# Patient Record
Sex: Female | Born: 1965 | Race: White | Hispanic: No | Marital: Single | Smoking: Never smoker
Health system: Southern US, Community
[De-identification: ages and names within clinical notes are randomized; demographics above are authoritative.]

## PROBLEM LIST (undated history)

## (undated) DIAGNOSIS — D509 Iron deficiency anemia, unspecified: Secondary | ICD-10-CM

## (undated) DIAGNOSIS — F410 Panic disorder [episodic paroxysmal anxiety] without agoraphobia: Secondary | ICD-10-CM

## (undated) DIAGNOSIS — J189 Pneumonia, unspecified organism: Secondary | ICD-10-CM

## (undated) DIAGNOSIS — F419 Anxiety disorder, unspecified: Secondary | ICD-10-CM

## (undated) DIAGNOSIS — D126 Benign neoplasm of colon, unspecified: Secondary | ICD-10-CM

## (undated) DIAGNOSIS — R112 Nausea with vomiting, unspecified: Secondary | ICD-10-CM

## (undated) DIAGNOSIS — Z9889 Other specified postprocedural states: Secondary | ICD-10-CM

## (undated) DIAGNOSIS — K589 Irritable bowel syndrome without diarrhea: Secondary | ICD-10-CM

## (undated) HISTORY — PX: ABLATION: SHX5711

## (undated) HISTORY — PX: COLONOSCOPY: SHX174

## (undated) HISTORY — DX: Benign neoplasm of colon, unspecified: D12.6

## (undated) HISTORY — DX: Iron deficiency anemia, unspecified: D50.9

## (undated) HISTORY — PX: CHOLECYSTECTOMY: SHX55

---

## 1999-09-07 ENCOUNTER — Emergency Department (HOSPITAL_COMMUNITY): Admission: EM | Admit: 1999-09-07 | Discharge: 1999-09-08 | Payer: Self-pay | Admitting: Emergency Medicine

## 1999-10-28 ENCOUNTER — Other Ambulatory Visit: Admission: RE | Admit: 1999-10-28 | Discharge: 1999-10-28 | Payer: Self-pay | Admitting: Gynecology

## 2000-06-17 ENCOUNTER — Other Ambulatory Visit: Admission: RE | Admit: 2000-06-17 | Discharge: 2000-06-17 | Payer: Self-pay | Admitting: Gynecology

## 2001-06-30 ENCOUNTER — Other Ambulatory Visit: Admission: RE | Admit: 2001-06-30 | Discharge: 2001-06-30 | Payer: Self-pay | Admitting: Gynecology

## 2002-02-12 ENCOUNTER — Emergency Department (HOSPITAL_COMMUNITY): Admission: EM | Admit: 2002-02-12 | Discharge: 2002-02-12 | Payer: Self-pay | Admitting: *Deleted

## 2002-10-05 ENCOUNTER — Emergency Department (HOSPITAL_COMMUNITY): Admission: EM | Admit: 2002-10-05 | Discharge: 2002-10-06 | Payer: Self-pay | Admitting: Emergency Medicine

## 2004-07-28 ENCOUNTER — Inpatient Hospital Stay (HOSPITAL_COMMUNITY): Admission: AD | Admit: 2004-07-28 | Discharge: 2004-07-28 | Payer: Self-pay | Admitting: Obstetrics and Gynecology

## 2004-08-06 ENCOUNTER — Other Ambulatory Visit: Admission: RE | Admit: 2004-08-06 | Discharge: 2004-08-06 | Payer: Self-pay | Admitting: Obstetrics and Gynecology

## 2005-02-16 ENCOUNTER — Inpatient Hospital Stay (HOSPITAL_COMMUNITY): Admission: AD | Admit: 2005-02-16 | Discharge: 2005-02-16 | Payer: Self-pay | Admitting: Obstetrics and Gynecology

## 2005-02-23 ENCOUNTER — Inpatient Hospital Stay (HOSPITAL_COMMUNITY): Admission: AD | Admit: 2005-02-23 | Discharge: 2005-02-23 | Payer: Self-pay | Admitting: Obstetrics and Gynecology

## 2005-02-28 ENCOUNTER — Inpatient Hospital Stay (HOSPITAL_COMMUNITY): Admission: AD | Admit: 2005-02-28 | Discharge: 2005-02-28 | Payer: Self-pay | Admitting: Obstetrics and Gynecology

## 2005-03-06 ENCOUNTER — Inpatient Hospital Stay (HOSPITAL_COMMUNITY): Admission: AD | Admit: 2005-03-06 | Discharge: 2005-03-06 | Payer: Self-pay | Admitting: Obstetrics and Gynecology

## 2005-03-08 ENCOUNTER — Inpatient Hospital Stay (HOSPITAL_COMMUNITY): Admission: AD | Admit: 2005-03-08 | Discharge: 2005-03-08 | Payer: Self-pay | Admitting: Obstetrics and Gynecology

## 2005-03-10 ENCOUNTER — Inpatient Hospital Stay (HOSPITAL_COMMUNITY): Admission: AD | Admit: 2005-03-10 | Discharge: 2005-03-12 | Payer: Self-pay | Admitting: Obstetrics and Gynecology

## 2005-04-21 ENCOUNTER — Other Ambulatory Visit: Admission: RE | Admit: 2005-04-21 | Discharge: 2005-04-21 | Payer: Self-pay | Admitting: Obstetrics and Gynecology

## 2006-09-11 ENCOUNTER — Ambulatory Visit: Payer: Self-pay | Admitting: Internal Medicine

## 2006-10-13 ENCOUNTER — Ambulatory Visit: Payer: Self-pay | Admitting: Internal Medicine

## 2006-10-13 LAB — CONVERTED CEMR LAB
ALT: 15 units/L (ref 0–40)
AST: 16 units/L (ref 0–37)
Albumin: 3.9 g/dL (ref 3.5–5.2)
Alkaline Phosphatase: 41 units/L (ref 39–117)
BUN: 11 mg/dL (ref 6–23)
Basophils Absolute: 0 10*3/uL (ref 0.0–0.1)
Basophils Relative: 0.6 % (ref 0.0–1.0)
CO2: 29 meq/L (ref 19–32)
Calcium: 9.1 mg/dL (ref 8.4–10.5)
Chloride: 103 meq/L (ref 96–112)
Chol/HDL Ratio, serum: 4.4
Cholesterol: 167 mg/dL (ref 0–200)
Creatinine, Ser: 0.8 mg/dL (ref 0.4–1.2)
Eosinophil percent: 1.5 % (ref 0.0–5.0)
GFR calc non Af Amer: 86 mL/min
Glomerular Filtration Rate, Af Am: 104 mL/min/{1.73_m2}
Glucose, Bld: 72 mg/dL (ref 70–99)
HCT: 36.6 % (ref 36.0–46.0)
HDL: 38.1 mg/dL — ABNORMAL LOW (ref >39.0)
Hemoglobin: 12.5 g/dL (ref 12.0–15.0)
LDL Cholesterol: 105 mg/dL — ABNORMAL HIGH (ref 0–99)
Lymphocytes Relative: 33.8 % (ref 12.0–46.0)
MCHC: 34.1 g/dL (ref 30.0–36.0)
MCV: 82.9 fL (ref 78.0–100.0)
Monocytes Absolute: 0.5 10*3/uL (ref 0.2–0.7)
Monocytes Relative: 7.6 % (ref 3.0–11.0)
Neutro Abs: 3.6 10*3/uL (ref 1.4–7.7)
Neutrophils Relative %: 56.5 % (ref 43.0–77.0)
Platelets: 230 10*3/uL (ref 150–400)
Potassium: 3.5 meq/L (ref 3.5–5.1)
RBC: 4.41 M/uL (ref 3.87–5.11)
RDW: 12.7 % (ref 11.5–14.6)
Sodium: 140 meq/L (ref 135–145)
TSH: 1.66 microintl units/mL (ref 0.35–5.50)
Total Bilirubin: 0.6 mg/dL (ref 0.3–1.2)
Total Protein: 7.4 g/dL (ref 6.0–8.3)
Triglyceride fasting, serum: 120 mg/dL (ref 0–149)
VLDL: 24 mg/dL (ref 0–40)
WBC: 6.4 10*3/uL (ref 4.5–10.5)

## 2006-10-19 ENCOUNTER — Ambulatory Visit: Payer: Self-pay | Admitting: Internal Medicine

## 2006-10-19 DIAGNOSIS — G43909 Migraine, unspecified, not intractable, without status migrainosus: Secondary | ICD-10-CM

## 2006-12-01 ENCOUNTER — Emergency Department (HOSPITAL_COMMUNITY): Admission: EM | Admit: 2006-12-01 | Discharge: 2006-12-01 | Payer: Self-pay | Admitting: Emergency Medicine

## 2006-12-28 ENCOUNTER — Ambulatory Visit (HOSPITAL_COMMUNITY): Admission: RE | Admit: 2006-12-28 | Discharge: 2006-12-29 | Payer: Self-pay | Admitting: General Surgery

## 2006-12-28 ENCOUNTER — Encounter (INDEPENDENT_AMBULATORY_CARE_PROVIDER_SITE_OTHER): Payer: Self-pay | Admitting: *Deleted

## 2007-02-24 ENCOUNTER — Ambulatory Visit: Payer: Self-pay | Admitting: Internal Medicine

## 2007-06-25 ENCOUNTER — Emergency Department (HOSPITAL_COMMUNITY): Admission: EM | Admit: 2007-06-25 | Discharge: 2007-06-25 | Payer: Self-pay | Admitting: Emergency Medicine

## 2007-07-08 ENCOUNTER — Other Ambulatory Visit: Admission: RE | Admit: 2007-07-08 | Discharge: 2007-07-08 | Payer: Self-pay | Admitting: Gynecology

## 2007-09-06 ENCOUNTER — Encounter: Admission: RE | Admit: 2007-09-06 | Discharge: 2007-09-06 | Payer: Self-pay | Admitting: Internal Medicine

## 2007-09-07 ENCOUNTER — Ambulatory Visit: Payer: Self-pay | Admitting: Internal Medicine

## 2007-09-07 DIAGNOSIS — J309 Allergic rhinitis, unspecified: Secondary | ICD-10-CM

## 2007-09-07 DIAGNOSIS — F410 Panic disorder [episodic paroxysmal anxiety] without agoraphobia: Secondary | ICD-10-CM

## 2007-11-16 ENCOUNTER — Ambulatory Visit: Payer: Self-pay | Admitting: Internal Medicine

## 2007-11-16 DIAGNOSIS — F329 Major depressive disorder, single episode, unspecified: Secondary | ICD-10-CM

## 2008-01-10 ENCOUNTER — Telehealth: Payer: Self-pay | Admitting: Internal Medicine

## 2008-03-20 ENCOUNTER — Telehealth: Payer: Self-pay | Admitting: Internal Medicine

## 2008-08-23 ENCOUNTER — Telehealth: Payer: Self-pay | Admitting: Internal Medicine

## 2008-10-26 ENCOUNTER — Ambulatory Visit: Payer: Self-pay | Admitting: Internal Medicine

## 2008-12-14 ENCOUNTER — Ambulatory Visit: Payer: Self-pay | Admitting: Internal Medicine

## 2008-12-14 LAB — CONVERTED CEMR LAB
ALT: 14 units/L (ref 0–35)
AST: 17 units/L (ref 0–37)
Albumin: 3.7 g/dL (ref 3.5–5.2)
Basophils Absolute: 0 10*3/uL (ref 0.0–0.1)
Bilirubin Urine: NEGATIVE
Bilirubin, Direct: 0.1 mg/dL (ref 0.0–0.3)
Blood in Urine, dipstick: NEGATIVE
Calcium: 8.8 mg/dL (ref 8.4–10.5)
Chloride: 105 meq/L (ref 96–112)
Cholesterol: 174 mg/dL (ref 0–200)
Creatinine, Ser: 0.7 mg/dL (ref 0.4–1.2)
Eosinophils Absolute: 0.1 10*3/uL (ref 0.0–0.7)
GFR calc Af Amer: 119 mL/min
Glucose, Bld: 82 mg/dL (ref 70–99)
HDL: 44.6 mg/dL (ref >39.0)
Iron: 42 ug/dL (ref 42–145)
Ketones, urine, test strip: NEGATIVE
Lymphocytes Relative: 31.4 % (ref 12.0–46.0)
MCHC: 33.5 g/dL (ref 30.0–36.0)
MCV: 77.2 fL — ABNORMAL LOW (ref 78.0–100.0)
Potassium: 4 meq/L (ref 3.5–5.1)
RBC: 4.25 M/uL (ref 3.87–5.11)
RDW: 15.5 % — ABNORMAL HIGH (ref 11.5–14.6)
Saturation Ratios: 10.1 % — ABNORMAL LOW (ref 20.0–50.0)
Total Protein: 7.2 g/dL (ref 6.0–8.3)
Transferrin: 296.7 mg/dL (ref >=212.0)
VLDL: 22 mg/dL (ref 0–40)
WBC Urine, dipstick: NEGATIVE
pH: 6

## 2008-12-21 ENCOUNTER — Ambulatory Visit: Payer: Self-pay | Admitting: Internal Medicine

## 2008-12-21 DIAGNOSIS — D509 Iron deficiency anemia, unspecified: Secondary | ICD-10-CM | POA: Insufficient documentation

## 2008-12-21 HISTORY — DX: Iron deficiency anemia, unspecified: D50.9

## 2009-01-05 ENCOUNTER — Telehealth: Payer: Self-pay | Admitting: Internal Medicine

## 2009-01-07 ENCOUNTER — Ambulatory Visit: Payer: Self-pay | Admitting: Family Medicine

## 2009-02-09 ENCOUNTER — Ambulatory Visit: Payer: Self-pay | Admitting: Family Medicine

## 2009-03-09 ENCOUNTER — Ambulatory Visit: Payer: Self-pay | Admitting: Internal Medicine

## 2009-03-09 LAB — CONVERTED CEMR LAB
Ferritin: 15.5 ng/mL (ref 10.0–291.0)
Lymphocytes Relative: 28.9 % (ref 12.0–46.0)
MCHC: 34.3 g/dL (ref 30.0–36.0)
Neutrophils Relative %: 60.8 % (ref 43.0–77.0)
Platelets: 283 10*3/uL (ref 150–400)
RDW: 15.3 % — ABNORMAL HIGH (ref 11.5–14.6)

## 2009-07-24 ENCOUNTER — Telehealth (INDEPENDENT_AMBULATORY_CARE_PROVIDER_SITE_OTHER): Payer: Self-pay | Admitting: *Deleted

## 2009-09-27 ENCOUNTER — Telehealth: Payer: Self-pay | Admitting: Internal Medicine

## 2009-11-21 ENCOUNTER — Ambulatory Visit (HOSPITAL_COMMUNITY): Admission: RE | Admit: 2009-11-21 | Discharge: 2009-11-21 | Payer: Self-pay | Admitting: Internal Medicine

## 2010-01-24 ENCOUNTER — Telehealth: Payer: Self-pay | Admitting: Internal Medicine

## 2010-02-09 ENCOUNTER — Emergency Department (HOSPITAL_BASED_OUTPATIENT_CLINIC_OR_DEPARTMENT_OTHER): Admission: EM | Admit: 2010-02-09 | Discharge: 2010-02-09 | Payer: Self-pay | Admitting: Emergency Medicine

## 2010-02-09 ENCOUNTER — Encounter (INDEPENDENT_AMBULATORY_CARE_PROVIDER_SITE_OTHER): Payer: Self-pay | Admitting: *Deleted

## 2010-02-09 ENCOUNTER — Ambulatory Visit: Payer: Self-pay | Admitting: Diagnostic Radiology

## 2010-02-12 ENCOUNTER — Ambulatory Visit: Payer: Self-pay | Admitting: Internal Medicine

## 2010-03-18 ENCOUNTER — Ambulatory Visit: Payer: Self-pay | Admitting: Internal Medicine

## 2010-03-19 ENCOUNTER — Telehealth: Payer: Self-pay | Admitting: Internal Medicine

## 2010-03-20 ENCOUNTER — Encounter: Payer: Self-pay | Admitting: Family Medicine

## 2010-04-22 ENCOUNTER — Emergency Department (HOSPITAL_BASED_OUTPATIENT_CLINIC_OR_DEPARTMENT_OTHER): Admission: EM | Admit: 2010-04-22 | Discharge: 2010-04-22 | Payer: Self-pay | Admitting: Internal Medicine

## 2010-04-22 ENCOUNTER — Ambulatory Visit: Payer: Self-pay | Admitting: Diagnostic Radiology

## 2010-04-22 ENCOUNTER — Telehealth: Payer: Self-pay | Admitting: Internal Medicine

## 2010-04-25 ENCOUNTER — Ambulatory Visit: Payer: Self-pay | Admitting: Internal Medicine

## 2010-04-26 LAB — CONVERTED CEMR LAB
ALT: 17 units/L (ref 0–35)
AST: 17 units/L (ref 0–37)
Alkaline Phosphatase: 32 units/L — ABNORMAL LOW (ref 39–117)
BUN: 12 mg/dL (ref 6–23)
Basophils Absolute: 0 10*3/uL (ref 0.0–0.1)
CO2: 30 meq/L (ref 19–32)
Chloride: 108 meq/L (ref 96–112)
Eosinophils Absolute: 0.1 10*3/uL (ref 0.0–0.7)
GFR calc non Af Amer: 97.81 mL/min (ref >60)
Hemoglobin: 10.7 g/dL — ABNORMAL LOW (ref 12.0–15.0)
MCHC: 33.5 g/dL (ref 30.0–36.0)
Monocytes Absolute: 0.3 10*3/uL (ref 0.1–1.0)
Monocytes Relative: 6.7 % (ref 3.0–12.0)
Neutro Abs: 2.9 10*3/uL (ref 1.4–7.7)
Potassium: 3.8 meq/L (ref 3.5–5.1)
Total Bilirubin: 0.3 mg/dL (ref 0.3–1.2)
WBC: 4.7 10*3/uL (ref 4.5–10.5)

## 2010-04-29 LAB — CONVERTED CEMR LAB
Ferritin: 7 ng/mL — ABNORMAL LOW (ref 10.0–291.0)
Iron: 28 ug/dL — ABNORMAL LOW (ref 42–145)
Transferrin: 264 mg/dL (ref 212.0–360.0)

## 2010-07-16 ENCOUNTER — Telehealth: Payer: Self-pay | Admitting: Internal Medicine

## 2010-08-02 ENCOUNTER — Ambulatory Visit: Payer: Self-pay | Admitting: Internal Medicine

## 2010-08-05 LAB — CONVERTED CEMR LAB
Basophils Absolute: 0 10*3/uL (ref 0.0–0.1)
Basophils Relative: 0.6 % (ref 0.0–3.0)
Eosinophils Absolute: 0.1 10*3/uL (ref 0.0–0.7)
MCV: 84.8 fL (ref 78.0–100.0)
Monocytes Relative: 7.4 % (ref 3.0–12.0)
Neutrophils Relative %: 62 % (ref 43.0–77.0)
Platelets: 247 10*3/uL (ref 150.0–400.0)
RDW: 14.8 % — ABNORMAL HIGH (ref 11.5–14.6)

## 2010-08-27 ENCOUNTER — Telehealth: Payer: Self-pay | Admitting: Internal Medicine

## 2010-09-11 ENCOUNTER — Ambulatory Visit: Payer: Self-pay | Admitting: Internal Medicine

## 2010-10-21 ENCOUNTER — Ambulatory Visit: Payer: Self-pay | Admitting: Internal Medicine

## 2010-10-21 DIAGNOSIS — J069 Acute upper respiratory infection, unspecified: Secondary | ICD-10-CM | POA: Insufficient documentation

## 2010-10-22 ENCOUNTER — Encounter: Payer: Self-pay | Admitting: Internal Medicine

## 2010-12-12 ENCOUNTER — Ambulatory Visit (HOSPITAL_COMMUNITY)
Admission: RE | Admit: 2010-12-12 | Discharge: 2010-12-12 | Payer: Self-pay | Source: Home / Self Care | Attending: Internal Medicine | Admitting: Internal Medicine

## 2010-12-12 ENCOUNTER — Encounter: Payer: Self-pay | Admitting: Internal Medicine

## 2011-01-19 ENCOUNTER — Encounter: Payer: Self-pay | Admitting: Internal Medicine

## 2011-01-30 NOTE — Assessment & Plan Note (Signed)
Summary: swollen areas around both clavicles/soreness/cjr   Vital Signs:  Patient profile:   45 year old female Height:      67.25 inches Weight:      176 pounds BMI:     27.46 Temp:     98.3 degrees F BP sitting:   120 / 80  (left arm) Cuff size:   regular  Vitals Entered By: Kern Reap CMA Duncan Dull) (March 18, 2010 12:32 PM) CC: swelling with neck Is Patient Diabetic? No Pain Assessment Patient in pain? no        CC:  swelling with neck.  History of Present Illness: 3 days ago she saw neck in mirror and thought it was swollen.  patient comes in with her friend.  Patient describes recently noticing that supraclavicular areas bilaterally and appear more full in usual.  She is concerned that this is a mass.  There is no tenderness.  She denies any trouble swallowing.  No unusual symptoms.  No unusual exposures.  No difficulty breathing.  No other associated symptoms.  No other complaints in a complete review of systems.  Current Problems (verified): 1)  Preventive Health Care  (ICD-V70.0) 2)  Anemia-iron Deficiency  (ICD-280.9) 3)  Depression  (ICD-311) 4)  Anxiety  (ICD-300.00) 5)  Allergic Rhinitis  (ICD-477.9) 6)  Migraine  (ICD-346.90)  Current Medications (verified): 1)  Ferrous Fumarate 325 Mg Tabs (Ferrous Fumarate) .... Take 1 Tablet By Mouth Two Times A Day 2)  Imitrex 100 Mg Tabs (Sumatriptan Succinate) .... Take 1 Tablet By Mouth Once A Day As Needed 3)  Ibuprofen 200 Mg Caps (Ibuprofen) .... As Needed 4)  Omeprazole 20 Mg Cpdr (Omeprazole) .... One By Mouth Daily  Allergies (verified): 1)  ! Citalopram Hydrobromide (Citalopram Hydrobromide)  Past History:  Past Medical History: Last updated: 12/21/2008 Allergic rhinitis Anxiety Depression Anemia-iron deficiency  2009 likely menorrhagia  Past Surgical History: Last updated: 09/07/2007 Panendoscopy  Family History: Last updated: 11-02-06 Father-deceased 6 yo pneurmonia Mother- deceased Brain  tumor  Social History: Last updated: 12/21/2008 Occupation: "[employer]" Married Never Smoked Alcohol use-no  Risk Factors: Smoking Status: never (02/12/2010)  Physical Exam  General:  alert and well-developed.   Head:  normocephalic and atraumatic.   Eyes:  pupils equal and pupils round.   Neck:  neck appears normal.  She has full range of motion.  The neck is thick with adipose tissue subcutaneously.  No supraclavicular masses.  No lymphadenopathy of the anterior cervical chain, supraclavicular areas or posterior cervical chain. Heart:  normal rate and regular rhythm.     Impression & Recommendations:  Problem # 1:  NECK PAIN (ICD-723.1)  subjective abnormality---neck  no objective findings reassured she and frien voice understnading  Her updated medication list for this problem includes:    Ibuprofen 200 Mg Caps (Ibuprofen) .Marland Kitchen... As needed  Complete Medication List: 1)  Ferrous Fumarate 325 Mg Tabs (Ferrous fumarate) .... Take 1 tablet by mouth two times a day 2)  Imitrex 100 Mg Tabs (Sumatriptan succinate) .... Take 1 tablet by mouth once a day as needed 3)  Ibuprofen 200 Mg Caps (Ibuprofen) .... As needed 4)  Omeprazole 20 Mg Cpdr (Omeprazole) .... One by mouth daily

## 2011-01-30 NOTE — Letter (Signed)
Summary: Out of School   at [employer]  [address] [place], [place] [address]   Phone: [phone]  Fax: [phone]    March 20, 2010   Student:  Donna Hancock    To Whom It May Concern:   For Medical reasons, please excuse the above named student from school for the following dates:  Start:   March 18, 2010  End:    March 19, 2010  If you need additional information, please feel free to contact our office.   Sincerely,    Birdie Sons, MD    ****This is a legal document and cannot be tampered with.  Schools are authorized to verify all information and to do so accordingly.

## 2011-01-30 NOTE — Progress Notes (Signed)
Summary: Pt would like to pick up copy of immunization record on 08/02/10  Phone Note Call from Patient Call back at Home Phone [phone]   Caller: Patient Reason for Call: Acute Illness Summary of Call: Pt needs to know if Dr. Cato Mulligan has a copy of her immunization records. Pt is needing to get a copy for college. Pt is coming in for cpx on 08/02/10 and pt would like to pick this up then.     Initial call taken by: Lucy Antigua,  July 16, 2010 11:37 AM  Follow-up for Phone Call        pt informed only immunization we have is td we gave- suggested calling school or pediatricain office Follow-up by: Willy Eddy, LPN,  July 16, 2010 11:48 AM

## 2011-01-30 NOTE — Assessment & Plan Note (Signed)
Summary: college cpx/pt will bring form/njr   Vital Signs:  Patient profile:   45 year old female Height:      67.25 inches Weight:      173 pounds BMI:     26.99 Temp:     98.1 degrees F oral Pulse rate:   72 / minute Resp:     14 per minute BP sitting:   120 / 70  (left arm)  Vitals Entered By: Willy Eddy, LPN (August 02, 2010 10:04 AM) CC: college cpx Is Patient Diabetic? No   CC:  college cpx.  History of Present Illness: CPX  Preventive Screening-Counseling & Management  Alcohol-Tobacco     Smoking Status: never  Current Problems (verified): 1)  Preventive Health Care  (ICD-V70.0) 2)  Anemia-iron Deficiency  (ICD-280.9) 3)  Depression  (ICD-311) 4)  Anxiety  (ICD-300.00) 5)  Allergic Rhinitis  (ICD-477.9) 6)  Migraine  (ICD-346.90)  Current Medications (verified): 1)  Hemocyte 324 Mg Tabs (Ferrous Fumarate) .... Take 1 Tablet By Mouth Two Times A Day 2)  Imitrex 100 Mg Tabs (Sumatriptan Succinate) .... Take 1 Tablet By Mouth Once A Day As Needed 3)  Ibuprofen 200 Mg Caps (Ibuprofen) .... As Needed 4)  Omeprazole 20 Mg Cpdr (Omeprazole) .... One By Mouth Daily  Allergies (verified): 1)  ! Citalopram Hydrobromide (Citalopram Hydrobromide)  Past History:  Past Medical History: Last updated: 12/21/2008 Allergic rhinitis Anxiety Depression Anemia-iron deficiency  2009 likely menorrhagia  Family History: Last updated: 11/16/06 Father-deceased 43 yo pneurmonia Mother- deceased Brain tumor  Social History: Last updated: 12/21/2008 Occupation: "[employer]" Married Never Smoked Alcohol use-no  Risk Factors: Smoking Status: never (08/02/2010)  Past Surgical History: Panendoscopy Cholecystectomy  Physical Exam  General:  alert and well-developed.   Head:  normocephalic and atraumatic.   Eyes:  pupils equal and pupils round.   Ears:  R ear normal and L ear normal.   Neck:  No deformities, masses, or tenderness noted. Chest Wall:   No deformities, masses, or tenderness noted. Lungs:  normal respiratory effort and no intercostal retractions.   Heart:  normal rate and regular rhythm.   Abdomen:  soft and non-tender.   Msk:  No deformity or scoliosis noted of thoracic or lumbar spine.   Pulses:  R and L carotid,radial,femoral,dorsalis pedis and posterior tibial pulses are full and equal bilaterally Neurologic:  cranial nerves II-XII intact and gait normal.   Skin:  turgor normal and color normal.   Cervical Nodes:  no anterior cervical adenopathy and no posterior cervical adenopathy.   Psych:  normally interactive and good eye contact.     Impression & Recommendations:  Problem # 1:  PREVENTIVE HEALTH CARE (ICD-V70.0) advised need for daily exercise, low fat diet and weight loss Orders: UA Dipstick w/o Micro (manual) ([address]109)  see immunization TB skin test  Complete Medication List: 1)  Hemocyte 324 Mg Tabs (Ferrous fumarate) .... Take 1 tablet by mouth two times a day 2)  Imitrex 100 Mg Tabs (Sumatriptan succinate) .... Take 1 tablet by mouth once a day as needed  Patient Instructions: 1)  schedule f/u immunizations in one month and 6 months 2)  get your skin TB checked on Monday Prescriptions: IMITREX 100 MG TABS (SUMATRIPTAN SUCCINATE) Take 1 tablet by mouth once a day as needed  #6 Each x 6   Entered and Authorized by:   Birdie Sons MD   Signed by:   Birdie Sons MD on 08/02/2010  Method used:   Electronically to        [facility] [phone]* (retail)       [address] [place], [place]  [address]       Ph: [phone]       Fax: [phone]   RxID:   [id number]     Appended Document: college cpx/pt will bring form/njr   Appended Document: Orders Update    Clinical Lists Changes  Orders: Added new Service order of State-Hepatitis B Vaccine Ped/Adol 3 dose IM  (27253G) - Signed Added new Service order of Admin 1st Vaccine (64403) - Signed Added new Service order of TB Skin  Test [id number]) - Signed Added new Service order of Admin of Any Addtl Vaccine (95638) - Signed Observations: Added new observation of TB-PPD LOT#: [id number] (08/02/2010 11:06) Added new observation of TB-PPD EXP: 10/27/2011 (08/02/2010 11:06) Added new observation of TB-PPD BY: Willy Eddy, LPN (95/18/8416 11:06) Added new observation of TB-PPD RTE: ID (08/02/2010 11:06) Added new observation of TB-PPD DSE: 0.1 ml (08/02/2010 11:06) Added new observation of TB-PPD MFR: Sanofi Pasteur (08/02/2010 11:06) Added new observation of TB-PPD SITE: left forearm (08/02/2010 11:06) Added new observation of TB-PPD: PPD (08/02/2010 11:06) Added new observation of HEPBVAX#1LOT: 1315z (08/02/2010 11:06) Added new observation of HEPBVAX#1EXP: 10/29/2011 (08/02/2010 11:06) Added new observation of HEPBVAX#1BY: Willy Eddy, LPN (60/63/0160 11:06) Added new observation of HEPBVAX#1RTE: IM (08/02/2010 11:06) Added new observation of HEPBVAX#1DSE: 0.1 ml (08/02/2010 11:06) Added new observation of HEPBVAX#1MFR: Merck (08/02/2010 11:06) Added new observation of HEPBVAX#1SIT: left deltoid (08/02/2010 11:06) Added new observation of HEPBVAX#1: State HepB Ped/Adol (08/02/2010 11:06)       Immunizations Administered:  Hepatitis B Vaccine # 1:    Vaccine Type: State HepB Ped/Adol    Site: left deltoid    Mfr: Merck    Dose: 0.1 ml    Route: IM    Given by: Willy Eddy, LPN    Exp. Date: 10/29/2011    Lot #: [id number]  PPD Skin Test:    Vaccine Type: PPD    Site: left forearm    Mfr: Sanofi Pasteur    Dose: 0.1 ml    Route: ID    Given by: Willy Eddy, LPN    Exp. Date: 10/27/2011    Lot #: [id number]  Appended Document: college cpx/pt will bring form/njr  Laboratory Results   Urine Tests    Routine Urinalysis   Color: yellow Appearance: Clear Glucose: negative   (Normal Range: Negative) Bilirubin: negative   (Normal Range: Negative) Ketone: negative   (Normal Range:  Negative) Spec. Gravity: 1.025   (Normal Range: 1.003-1.035) Blood: negative   (Normal Range: Negative) pH: 6.5   (Normal Range: 5.0-8.0) Protein: negative   (Normal Range: Negative) Urobilinogen: 0.2   (Normal Range: 0-1) Nitrite: negative   (Normal Range: Negative) Leukocyte Esterace: negative   (Normal Range: Negative)    Comments: Rita Ohara  August 02, 2010 11:47 AM      Appended Document: Orders Update    Clinical Lists Changes        PPD Results    Date of reading: 08/05/2010    Results: < 5mm    Interpretation: negative

## 2011-01-30 NOTE — Assessment & Plan Note (Signed)
Summary: sore throat/cough/congestion/cjr   Vital Signs:  Patient profile:   45 year old female Weight:      172 pounds Temp:     99.0 degrees F oral BP sitting:   110 / 72  (left arm) Cuff size:   regular  Vitals Entered By: Alfred Levins, CMA (October 21, 2010 10:16 AM) CC: st off and on x2 wks, cough, no fever   CC:  st off and on x2 wks, cough, and no fever.  History of Present Illness: st on and off of r 2 weeks no fever or chills no sweats some pain with swallowing  All other systems reviewed and were negative   Current Medications (verified): 1)  Hemocyte 324 Mg Tabs (Ferrous Fumarate) .... Take 1 Tablet By Mouth Two Times A Day 2)  Imitrex 100 Mg Tabs (Sumatriptan Succinate) .... Take 1 Tablet By Mouth Once A Day As Needed  Allergies (verified): 1)  ! Citalopram Hydrobromide (Citalopram Hydrobromide)  Past History:  Past Medical History: Last updated: 12/21/2008 Allergic rhinitis Anxiety Depression Anemia-iron deficiency  2009 likely menorrhagia  Past Surgical History: Last updated: 08/02/2010 Panendoscopy Cholecystectomy  Family History: Last updated: 2006/11/09 Father-deceased 38 yo pneurmonia Mother- deceased Brain tumor  Social History: Last updated: 12/21/2008 Occupation: "[employer]" Married Never Smoked Alcohol use-no  Risk Factors: Smoking Status: never (08/02/2010)  Physical Exam  General:  Well-developed,well-nourished,in no acute distress; alert,appropriate and cooperative throughout examination Mouth:  aphthous ulcer soft palate on left.  Neck:  No deformities, masses, or tenderness noted. Lungs:  Normal respiratory effort, chest expands symmetrically. Lungs are clear to auscultation, no crackles or wheezes.   Impression & Recommendations:  Problem # 1:  URI (ICD-465.9)  ulcer--soft palate ? herpetic--- send for culture.  call if sxs persist.   Orders: T-Herpes Culture (53664)  Complete Medication List: 1)   Hemocyte 324 Mg Tabs (Ferrous fumarate) .... Take 1 tablet by mouth two times a day 2)  Imitrex 100 Mg Tabs (Sumatriptan succinate) .... Take 1 tablet by mouth once a day as needed   Orders Added: 1)  T-Herpes Culture [40347] 2)  Est. Patient Level III [42595]

## 2011-01-30 NOTE — Progress Notes (Signed)
Summary: Pt req doctors note excusing her from class on 03/18/10  Phone Note Call from Patient Call back at Home Phone [phone]   Caller: Patient Summary of Call: Pt called and is needing to get a doctors note, excuses from class on 03/18/10. Please send to [facility]. Pt will call back to give fax #.    Initial call taken by: Lucy Antigua,  March 19, 2010 3:50 PM  Follow-up for Phone Call        ok to write note that she was seen on 03/18/10 Follow-up by: Birdie Sons MD,  March 19, 2010 5:25 PM  Additional Follow-up for Phone Call Additional follow up Details #1::        faxed to admin Additional Follow-up by: Kern Reap CMA Duncan Dull),  March 20, 2010 10:48 AM

## 2011-01-30 NOTE — Progress Notes (Signed)
Summary: NEED SAMPLES  Phone Note Call from Patient Call back at [phone]   Caller: Patient-LIVE Call For: DR Burnadette Baskett Summary of Call: WOULD LIKE MORE SAMPLES FOR MIGARAINES- NOT IIMITREX. PT STATED THAT SHE HAS NO INS. OR JOB. Initial call taken by: Warnell Forester,  January 10, 2008 4:10 PM  Follow-up for Phone Call        maxalt 10mg  1 by mouth at onset of headache---ok to give 3 samples if we have them Follow-up by: Birdie Sons MD,  January 11, 2008 9:31 PM  Additional Follow-up for Phone Call Additional follow up Details #1::        PT CALLED - NEED STATUS OF MESSAGE Additional Follow-up by: Warnell Forester,  January 12, 2008 9:05 AM    Additional Follow-up for Phone Call Additional follow up Details #2::    Called pt, Fiance Indiyah Paone will pick up samples, informed he will need to show identification.  Pt has a migraine and not feeling well enough to get out. Follow-up by: Sid Falcon LPN,  January 12, 2008 10:13 AM

## 2011-01-30 NOTE — Assessment & Plan Note (Signed)
Summary: 2 hep-b vaccine/cjr  Nurse Visit   Allergies: 1)  ! Citalopram Hydrobromide (Citalopram Hydrobromide)  Immunizations Administered:  Hepatitis B Vaccine # 2:    Vaccine Type: HepB Adult    Site: right deltoid    Mfr: Merck    Dose: 1.0 ml    Route: IM    Given by: Kern Reap CMA (AAMA)    Exp. Date: 09/29/2012    Lot #: [id number]    Physician counseled: yes  Orders Added: 1)  Hepatitis B Vaccine >39yrs [90746] 2)  Admin 1st Vaccine [phone]

## 2011-01-30 NOTE — Progress Notes (Signed)
Summary: REQ FOR LABWORK?  Phone Note Call from Patient   Caller: Patient  [phone] Reason for Call: Talk to Nurse Summary of Call: Pt called to speak with Dr Cato Mulligan /  Alvino Chapel, RN.... Pt adv that her neck is bothering her again (bilateral neck edema / discomfort w/ range of motion) and per Dr Cato Mulligan at her last OV, he adv her that if the condition didn't get better she would need to come in for labwork.Marland KitchenMarland KitchenPt req to come in for same... Can you advise order for labs?  Pt can be reached at [phone] with any questions or concerns.  Initial call taken by: Debbra Riding,  April 22, 2010 8:23 AM  Follow-up for Phone Call        CBC, basic metabolic profile, liver function tests ICD-9 code 995.2 Follow-up by: Birdie Sons MD,  April 22, 2010 11:46 AM  Additional Follow-up for Phone Call Additional follow up Details #1::        Phone Call Completed----Contacted pt and appt was scheduled for Thursday, May 02, 2010 at 9:45am.  Additional Follow-up by: Debbra Riding,  April 22, 2010 11:54 AM

## 2011-01-30 NOTE — Assessment & Plan Note (Signed)
Summary: ABDOMINAL PAIN X1WEEK/TM    Current Allergies: No known allergies   The patient and/or caregiver has been counseled thoroughly with regard to medications prescribed including dosage, schedule, interactions, rationale for use, and possible side effects and they verbalize understanding.  Diagnoses and expected course of recovery discussed and will return if not improved as expected or if the condition worsens. Patient and/or caregiver verbalized understanding.

## 2011-01-30 NOTE — Miscellaneous (Signed)
Summary: Immunization Entry   Immunization History:  Influenza Immunization History:    Influenza:  historical (11/28/2010)

## 2011-01-30 NOTE — Progress Notes (Signed)
Summary: letterhead need  Phone Note Call from Patient Call back at Home Phone [phone]   Caller: Patient via VM Call For: Birdie Sons MD Summary of Call: Needs TB skin test results with date,  varicella titer with date, and Hep B #1 with date on letterhead for college.  Needs to pick up tomorrow.  Please call when ready. Initial call taken by: Gladis Riffle, RN,  August 27, 2010 2:22 PM  Follow-up for Phone Call        Pt called re: status of letter. Pt needs to pick this up today because she has clinicals at [facility] tomorrow. Pls call when ready.  Follow-up by: Lucy Antigua,  August 28, 2010 11:06 AM  Additional Follow-up for Phone Call Additional follow up Details #1::        Letter was completed yesterday and pt was notified.  She is to pick it up on 08/29/10. Additional Follow-up by: Gladis Riffle, RN,  August 29, 2010 11:24 AM

## 2011-01-30 NOTE — Assessment & Plan Note (Signed)
Summary: ABD PAIN (RADIATES TO BACK) - POST ED F/U // RS   Vital Signs:  Patient profile:   45 year old female Weight:      174 pounds Temp:     98.4 degrees F Pulse rate:   80 / minute Resp:     12 per minute BP sitting:   110 / 76  (left arm)  Vitals Entered By: Gladis Riffle, RN (February 12, 2010 10:23 AM) CC: c/o abdominal pain and cramping since 02/08/10; went to ER and given xanax and told to see PCP Is Patient Diabetic? No Comments given xanax for "abdominal migraines" and IV morphine for relaxation   CC:  c/o abdominal pain and cramping since 02/08/10; went to ER and given xanax and told to see PCP.  History of Present Illness: abdominal pain--intermittent pain for several weeks. Very similar to previous abdominal pain. Reviewed EGD report. Reviewed Dr. Janese Banks ED note. no fever or chills.  location: more right sided 2-5/10 pain described as a cramping discomfort after eating.  BMs and appetite are normal  ulcers in mouth---has had for several years---now resolving  Preventive Screening-Counseling & Management  Alcohol-Tobacco     Smoking Status: never  Current Medications (verified): 1)  Ferrous Fumarate 325 Mg Tabs (Ferrous Fumarate) .... Take 1 Tablet By Mouth Two Times A Day 2)  Imitrex 100 Mg Tabs (Sumatriptan Succinate) .... Take 1 Tablet By Mouth Once A Day As Needed 3)  Ibuprofen 200 Mg Caps (Ibuprofen) .... As Needed  Allergies (verified): 1)  ! Citalopram Hydrobromide (Citalopram Hydrobromide)  Past History:  Past Medical History: Last updated: 12/21/2008 Allergic rhinitis Anxiety Depression Anemia-iron deficiency  2009 likely menorrhagia  Past Surgical History: Last updated: 09/07/2007 Panendoscopy  Family History: Last updated: 2006/11/09 Father-deceased 69 yo pneurmonia Mother- deceased Brain tumor  Social History: Last updated: 12/21/2008 Occupation: "[employer]" Married Never Smoked Alcohol use-no  Risk Factors: Smoking  Status: never (02/12/2010)  Review of Systems       All other systems reviewed and were negative   Physical Exam  General:  Well-developed,well-nourished,in no acute distress; alert,appropriate and cooperative throughout examination Head:  normocephalic and atraumatic.   Eyes:  pupils equal and pupils round.   Ears:  R ear normal and L ear normal.   Neck:  No deformities, masses, or tenderness noted. Chest Wall:  No deformities, masses, or tenderness noted. Lungs:  Normal respiratory effort, chest expands symmetrically. Lungs are clear to auscultation, no crackles or wheezes. Heart:  Normal rate and regular rhythm. S1 and S2 normal without gallop, murmur, click, rub or other extra sounds. Abdomen:  Bowel sounds positive,abdomen soft and non-tender without masses, organomegaly or hernias noted. scars from laparoscopy  overweight Msk:  No deformity or scoliosis noted of thoracic or lumbar spine.   Neurologic:  cranial nerves II-XII intact and gait normal.   Skin:  turgor normal and color normal.     Impression & Recommendations:  Problem # 1:  ABDOMINAL PAIN (ICD-789.00) unclear etiology has had cholecystectomy trial PPI call if sxs persist (should resolve in one week or less).  call sooner for any worsening sxs  Complete Medication List: 1)  Ferrous Fumarate 325 Mg Tabs (Ferrous fumarate) .... Take 1 tablet by mouth two times a day 2)  Imitrex 100 Mg Tabs (Sumatriptan succinate) .... Take 1 tablet by mouth once a day as needed 3)  Ibuprofen 200 Mg Caps (Ibuprofen) .... As needed 4)  Omeprazole 20 Mg Cpdr (Omeprazole) .... One by  mouth daily  Patient Instructions: 1)  call me for worsening or if symptoms persist in 2 weeks Prescriptions: OMEPRAZOLE 20 MG CPDR (OMEPRAZOLE) one by mouth daily  #30 x 0   Entered and Authorized by:   Birdie Sons MD   Signed by:   Birdie Sons MD on 02/12/2010   Method used:   Electronically to        [facility] [phone]* (retail)        [address] [place], [place]  [address]       Ph: [phone]       Fax: [phone]   RxID:   [id number]

## 2011-01-30 NOTE — Progress Notes (Signed)
Summary: REQ FOR NEW MED  Phone Note Call from Patient   Caller: Patient [phone] Reason for Call: Talk to Nurse, Talk to Doctor Summary of Call: Pt adv that she has had a mouth ulcer x 2 wks and would like to see if there was any possible way that Dr Cato Mulligan could send in a RX for med to help alieviate sxs...Marland KitchenMarland KitchenPt adv that same has been treated w/ otc meds / home remedy w/ no relief.... Pt adv that same could be sent to [facility] in [place] [phone].  Pt can reached @ [phone] with any questions or concerns.  Initial call taken by: Debbra Riding,  January 24, 2010 4:46 PM  Follow-up for Phone Call        I have not evaluated so would not know how to treat. if she is looking for pain relief use oragel Follow-up by: Birdie Sons MD,  January 25, 2010 11:57 AM  Additional Follow-up for Phone Call Additional follow up Details #1::        Left message on pt personal voice mail of above. Additional Follow-up by: Gladis Riffle, RN,  January 25, 2010 1:31 PM

## 2011-03-20 LAB — URINALYSIS, ROUTINE W REFLEX MICROSCOPIC
Glucose, UA: NEGATIVE mg/dL
Hgb urine dipstick: NEGATIVE
Nitrite: NEGATIVE
Protein, ur: NEGATIVE mg/dL
Specific Gravity, Urine: 1.027 (ref 1.005–1.030)
pH: 6.5 (ref 5.0–8.0)

## 2011-03-20 LAB — URINE MICROSCOPIC-ADD ON

## 2011-03-20 LAB — COMPREHENSIVE METABOLIC PANEL
ALT: 13 U/L (ref 0–35)
AST: 19 U/L (ref 0–37)
Albumin: 4.5 g/dL (ref 3.5–5.2)
CO2: 30 mEq/L (ref 19–32)
Calcium: 9 mg/dL (ref 8.4–10.5)
Chloride: 103 mEq/L (ref 96–112)
Creatinine, Ser: 0.7 mg/dL (ref 0.4–1.2)
Potassium: 4.1 mEq/L (ref 3.5–5.1)
Total Protein: 8.3 g/dL (ref 6.0–8.3)

## 2011-03-20 LAB — CBC
Hemoglobin: 12.3 g/dL (ref 12.0–15.0)
MCHC: 34.8 g/dL (ref 30.0–36.0)
RBC: 4.27 MIL/uL (ref 3.87–5.11)
WBC: 6.3 10*3/uL (ref 4.0–10.5)

## 2011-03-20 LAB — LIPASE, BLOOD: Lipase: 59 U/L (ref 23–300)

## 2011-03-20 LAB — DIFFERENTIAL
Basophils Absolute: 0.1 10*3/uL (ref 0.0–0.1)
Basophils Relative: 2 % — ABNORMAL HIGH (ref 0–1)
Eosinophils Relative: 2 % (ref 0–5)
Lymphocytes Relative: 32 % (ref 12–46)

## 2011-03-30 ENCOUNTER — Encounter: Payer: Self-pay | Admitting: Emergency Medicine

## 2011-03-30 ENCOUNTER — Inpatient Hospital Stay (INDEPENDENT_AMBULATORY_CARE_PROVIDER_SITE_OTHER)
Admission: RE | Admit: 2011-03-30 | Discharge: 2011-03-30 | Disposition: A | Discharge status: Home / Self Care | Payer: Self-pay | Source: Ambulatory Visit | Attending: Emergency Medicine | Admitting: Emergency Medicine

## 2011-03-30 DIAGNOSIS — K120 Recurrent oral aphthae: Secondary | ICD-10-CM

## 2011-03-30 DIAGNOSIS — J069 Acute upper respiratory infection, unspecified: Secondary | ICD-10-CM

## 2011-03-30 LAB — CONVERTED CEMR LAB: Rapid Strep: NEGATIVE

## 2011-05-06 ENCOUNTER — Other Ambulatory Visit: Payer: Self-pay | Admitting: Internal Medicine

## 2011-05-09 ENCOUNTER — Ambulatory Visit (INDEPENDENT_AMBULATORY_CARE_PROVIDER_SITE_OTHER): Payer: 59 | Admitting: Family Medicine

## 2011-05-09 ENCOUNTER — Encounter: Payer: Self-pay | Admitting: Family Medicine

## 2011-05-09 VITALS — BP 120/82 | Temp 98.6°F

## 2011-05-09 DIAGNOSIS — M549 Dorsalgia, unspecified: Secondary | ICD-10-CM

## 2011-05-09 DIAGNOSIS — D649 Anemia, unspecified: Secondary | ICD-10-CM

## 2011-05-09 LAB — POCT URINALYSIS DIPSTICK
Glucose, UA: NEGATIVE
Leukocytes, UA: NEGATIVE
Nitrite, UA: NEGATIVE
Spec Grav, UA: 1.022

## 2011-05-09 LAB — POCT HEMOGLOBIN: Hemoglobin: 11.9

## 2011-05-09 NOTE — Progress Notes (Signed)
  Subjective:    Patient ID: Donna Hancock, female    DOB: [birth year], 45 y.o.   MRN: [id number]  HPI Patient seen for the following  Right upper lumbar pain for 4 days. Mild to moderate pain.   Achy quality of pain. She was concerned about possible kidney infection. She denies any fever or chills. No nausea or vomiting. No dysuria. Has had some mild urine frequency. Worse with movement. Some relief with Aleve. Denies any abdominal pain. No reported injury. No rashes. Denies any radiculopathy symptoms. No numbness or weakness.  History of iron deficiency anemia. Per patient, when checked by gynecologist recently hemoglobin was around 10 range. She takes Hemocyte twice daily. Generally menses last about 4-5 days. No recent dizziness. No bloody stools. No recent stool changes.   Review of Systems  Constitutional: Negative for fever and chills.  Respiratory: Negative for cough and shortness of breath.   Cardiovascular: Negative for chest pain, palpitations and leg swelling.  Gastrointestinal: Negative for nausea, vomiting, abdominal pain, diarrhea, constipation and blood in stool.  Genitourinary: Positive for frequency. Negative for dysuria, hematuria, decreased urine volume and difficulty urinating.  Neurological: Negative for dizziness, syncope, weakness and headaches.  Hematological: Negative for adenopathy. Does not bruise/bleed easily.       Objective:   Physical Exam  Constitutional: She is oriented to person, place, and time. She appears well-developed and well-nourished. No distress.  Cardiovascular: Normal rate, regular rhythm and normal heart sounds.   No murmur heard. Pulmonary/Chest: Effort normal. No respiratory distress. She has no wheezes. She has no rales.  Abdominal: Soft. Bowel sounds are normal. She exhibits no distension. There is no tenderness. There is no rebound and no guarding.  Musculoskeletal: She exhibits no edema.       Straight leg raise is negative. No lower  injury edema.  Neurological: She is alert and oriented to person, place, and time.       Full-strength lower extremities. Normal sensory function. Deep reflexes 2+ knee and ankle          Assessment & Plan:  #1 low back pain. Suspect muscular. Urine dipstick unremarkable. Try Aleve and reviewed extension stretches for back. Work on weight loss and abdominal muscle strengthening #2 history of anemia. Recheck hemoglobin today

## 2011-05-09 NOTE — Patient Instructions (Signed)
Walk and exercise as tolerated. Take over the counter Aleve as needed for back pain.

## 2011-05-16 NOTE — Op Note (Signed)
NAMEThornton Hancock                ACCOUNT NO.:  [id number]   MEDICAL RECORD NO.:  [id number]          PATIENT TYPE:  OIB   LOCATION:  0098                         FACILITY:  [facility]   PHYSICIAN:  Anselm Pancoast. Weatherly, M.D.DATE OF BIRTH:  1966-07-10   DATE OF PROCEDURE:  DATE OF DISCHARGE:                               OPERATIVE REPORT   PREOPERATIVE DIAGNOSIS:  Chronic cholecystitis with stones.   POSTOPERATIVE DIAGNOSIS:  Chronic cholecystitis with stones.   OPERATION:  Laparoscopic cholecystectomy with cholangiogram.   HISTORY:  Donna Hancock is a 45 year old female who was referred to  me by Dr. Estell Harpin in the [facility] ER.  Dr. Birdie Sons is her regular  physician.  She had episodes of epigastric and abdominal/chest pain.  She had two episodes of pain, first in early November.  She had another  back in the first of December.  Epigastric, radiating to the back.  The  pain over the last several hours has subsided.  The last one occurred  after eating breakfast, and she saw Dr. Estell Harpin in the emergency room,  and her pain was quite severe.  He obtained an ultrasound of the  gallbladder that showed numerous stones about her gallbladder.  She has  a child, approximately now 96 old, works as a Veterinary surgeon, and  wanted to be added onto the OR schedule this month if possible, because  of insurance issues.  Patient does not smoke or drink alcohol and is  here for a planned laparoscopic cholecystectomy.   The pain was certainly consistent with pain radiating to the small of  the back.  Liver tests now are normal.  Preoperatively, she was given  Unasyn and PAS stockings. Induction of general anesthesia, endotracheal  tube, oral tube into the stomach, and then the abdomen was prepped with  Betadine scrub and solution.  A small incision was made below the  umbilicus.  The fascia was identified.  I picked up between two Kochers  and a small opening made carefully into the  preperitoneal space.  The  fascia was fairly strong in the peritoneum.  I opened this up between  two hemostats carefully , and a purse-string suture of 0 Vicryl was  placed and __________.  The gallbladder was thickened but not acutely  inflamed.  The upper 10 mm trocar was placed in the subxiphoid area.  A  5 mm trocar was placed __________  position.  The gallbladder was  retracted upward and outward.  The fatty tissue over the proximal  portion of the gallbladder __________.  It was noted that she has a very  short cystic duct.  You could see the common bile duct and common  hepatic duct.  I placed a clip flush with the cystic duct/gallbladder  junction.  I then made a little opening just proximal.  The taut  catheter was introduced and placed in this, held in place with a clip.  An x-ray was obtained.  She has a very short cystic duct, to the common  bile duct and duodenum.  The hepatic radicals filled.  There was  no  evidence of any stone in the common bile duct.  The catheter was  removed.  The cystic duct was triply clipped, very deep and close to  where the little opening had been made so that we do not compromise the  cystic duct, common hepatic/common bile duct junction.  The clips were  seen to be firmly attached, and the cystic duct was divided.  We then  identified the cystic artery, which was doubly clipped proximally,  singly distally, and then the little posterior branch of the cystic  artery __________.  The gallbladder placed in an EndoCatch bag.  Inspection of the __________ was done, good hemostasis.  Irrigating  fluids __________  layer is on the gallbladder bed.   Next, the camera was switched to the upper 10 mm port.  The gallbladder  bag was grasped with a grasper and brought out through the umbilicus.  A  figure-of-eight suture of 0 Vicryl with an enlargement needle was placed  in addition.  Next, the subcutaneous tissue was closed with 4-0 Vicryl.  I did place  a fascial suture in the subxiphoid  area with 0 Vicryl before the subcuticular sutures.  Benzoin and Steri-  Strips were placed on the skin.  The patient tolerated the procedure  nicely.  She wants to spend the night, since she lives alone, and should  be ready to be discharged a.m.           ______________________________  Anselm Pancoast. Zachery Dakins, M.D.     WJW/MEDQ  D:  12/28/2006  T:  12/28/2006  Job:  [id number]   cc:   Valetta Mole. Swords, MD  [address] [place]  [place] [address]

## 2011-07-08 ENCOUNTER — Encounter: Payer: Self-pay | Admitting: Emergency Medicine

## 2011-07-08 ENCOUNTER — Inpatient Hospital Stay (INDEPENDENT_AMBULATORY_CARE_PROVIDER_SITE_OTHER)
Admission: RE | Admit: 2011-07-08 | Discharge: 2011-07-08 | Disposition: A | Discharge status: Home / Self Care | Payer: 59 | Source: Ambulatory Visit | Attending: Emergency Medicine | Admitting: Emergency Medicine

## 2011-07-08 DIAGNOSIS — M549 Dorsalgia, unspecified: Secondary | ICD-10-CM | POA: Insufficient documentation

## 2011-07-08 LAB — CONVERTED CEMR LAB
Bilirubin Urine: NEGATIVE
Glucose, Urine, Semiquant: NEGATIVE
Nitrite: NEGATIVE
Urobilinogen, UA: 0.2
WBC Urine, dipstick: NEGATIVE

## 2011-07-10 ENCOUNTER — Telehealth (INDEPENDENT_AMBULATORY_CARE_PROVIDER_SITE_OTHER): Payer: Self-pay | Admitting: *Deleted

## 2011-08-28 ENCOUNTER — Other Ambulatory Visit: Payer: Self-pay | Admitting: Internal Medicine

## 2011-09-24 ENCOUNTER — Encounter: Payer: Self-pay | Admitting: Emergency Medicine

## 2011-09-24 ENCOUNTER — Inpatient Hospital Stay (INDEPENDENT_AMBULATORY_CARE_PROVIDER_SITE_OTHER)
Admission: RE | Admit: 2011-09-24 | Discharge: 2011-09-24 | Disposition: A | Discharge status: Home / Self Care | Payer: 59 | Source: Ambulatory Visit | Attending: Emergency Medicine | Admitting: Emergency Medicine

## 2011-09-24 DIAGNOSIS — R05 Cough: Secondary | ICD-10-CM

## 2011-09-24 DIAGNOSIS — J069 Acute upper respiratory infection, unspecified: Secondary | ICD-10-CM

## 2011-10-08 ENCOUNTER — Emergency Department (HOSPITAL_BASED_OUTPATIENT_CLINIC_OR_DEPARTMENT_OTHER)
Admission: EM | Admit: 2011-10-08 | Discharge: 2011-10-08 | Disposition: A | Discharge status: Home / Self Care | Payer: 59 | Attending: Emergency Medicine | Admitting: Emergency Medicine

## 2011-10-08 ENCOUNTER — Encounter (HOSPITAL_BASED_OUTPATIENT_CLINIC_OR_DEPARTMENT_OTHER): Payer: Self-pay

## 2011-10-08 DIAGNOSIS — F419 Anxiety disorder, unspecified: Secondary | ICD-10-CM

## 2011-10-08 DIAGNOSIS — F411 Generalized anxiety disorder: Secondary | ICD-10-CM | POA: Insufficient documentation

## 2011-10-08 HISTORY — DX: Anxiety disorder, unspecified: F41.9

## 2011-10-08 HISTORY — DX: Panic disorder (episodic paroxysmal anxiety): F41.0

## 2011-10-08 MED ORDER — LORAZEPAM 1 MG PO TABS
1.0000 mg | ORAL_TABLET | Freq: Once | ORAL | Status: AC
  Administered 2011-10-08: 1 mg via ORAL
  Filled 2011-10-08: qty 1

## 2011-10-08 MED ORDER — LORAZEPAM 1 MG PO TABS: 1.0000 mg | ORAL_TABLET | Freq: Three times a day (TID) | ORAL | Status: DC | PRN

## 2011-10-08 NOTE — ED Provider Notes (Signed)
History     CSN: [id number] Arrival date & time: 10/08/2011 10:15 PM  Chief Complaint  Patient presents with  . Panic Attack     HPI Patient reports developing a panic attack while driving.  She reports that chest tightness and inability to free the symptoms resolve.  She reports a long-standing history of panic attacks.  She reports she's been on multiple medications none of which have helped.  She previously was on Xanax however she herself stopped this.  She reports increasing stressors at school currently.  Nothing improves her symptoms.  Nothing worsens her symptoms.  Her symptoms were severe or new onset.  She currently has no symptoms  Past Medical History  Diagnosis Date  . Panic attack   . Anxiety     Past Surgical History  Procedure Date  . Cholecystectomy     No family history on file.  History  Substance Use Topics  . Smoking status: Never Smoker   . Smokeless tobacco: Not on file  . Alcohol Use: No    OB History    Grav Para Term Preterm Abortions TAB SAB Ect Mult Living                  Review of Systems  All other systems reviewed and are negative.    Allergies  Citalopram hydrobromide  Home Medications   Current Outpatient Rx  Name Route Sig Dispense Refill  . HEMOCYTE 324 MG PO TABS  TAKE ONE TABLET BY MOUTH TWICE DAILY 60 each 2  . ONE-DAILY MULTI VITAMINS PO TABS Oral Take 1 tablet by mouth daily.      Marland Kitchen PSEUDOEPHEDRINE-ACETAMINOPHEN 30-500 MG PO TABS Oral Take 2 tablets by mouth every 4 (four) hours as needed. For congestion     . SUMATRIPTAN SUCCINATE 100 MG PO TABS         BP 122/76  Pulse 86  Temp(Src) 98.3 F (36.8 C) (Oral)  Resp 15  Ht 5\' 8"  (1.727 m)  Wt 170 lb (77.111 kg)  BMI 25.85 kg/m2  SpO2 100%  LMP 10/07/2011  Physical Exam  Nursing note and vitals reviewed. Constitutional: She is oriented to person, place, and time. She appears well-developed and well-nourished.  HENT:  Head: Normocephalic.  Eyes: EOM are  normal.  Neck: Normal range of motion.  Cardiovascular: Normal rate and regular rhythm.   Pulmonary/Chest: Effort normal.  Musculoskeletal: Normal range of motion.  Neurological: She is alert and oriented to person, place, and time.  Skin: Skin is warm and dry.  Psychiatric:       Anxious     ED Course  Procedures (including critical care time)  Labs Reviewed - No data to display No results found.   1. Anxiety       MDM  Discharge home.  One Ativan given in the emergency department.  She's been instructed to followup with her primary care doctor.  I will send her on a short course of Ativan when necessary anxiety        Lyanne Co, MD 10/08/11 2249

## 2011-10-08 NOTE — ED Notes (Signed)
C/o panic attack while driving-NAD at present

## 2011-10-17 ENCOUNTER — Ambulatory Visit: Payer: 59 | Admitting: Family Medicine

## 2011-10-17 ENCOUNTER — Telehealth: Payer: Self-pay | Admitting: Internal Medicine

## 2011-10-17 MED ORDER — LORAZEPAM 1 MG PO TABS: 1.0000 mg | ORAL_TABLET | Freq: Three times a day (TID) | ORAL | Status: AC | PRN

## 2011-10-17 NOTE — Telephone Encounter (Signed)
Pt aware however she stated that Dr Clent Ridges was going to give her lorazepam for 30 days.  She was told that by someone up front.  Also she would like to switch PCP to Dr Clent Ridges

## 2011-10-17 NOTE — Telephone Encounter (Signed)
Was seen in th er and was told to follow up with any doctor. That gave her Lorazepam 1mg  for her anxiety. Also, she went to her urgent care for cough and would like anorther rx for Tussinex cough syrup. Please call [facility]. Thanks.

## 2011-10-17 NOTE — Telephone Encounter (Signed)
Script was called in.

## 2011-10-17 NOTE — Telephone Encounter (Signed)
I will let Dr. Cato Mulligan handle this

## 2011-10-17 NOTE — Telephone Encounter (Signed)
Use mucinex dm bid for cough  Refer to psychiatry for ongoing lorazepam

## 2011-10-17 NOTE — Telephone Encounter (Signed)
Call in Lorazepam #60 with no refills. No I will NOT be accepting her as a patient

## 2011-11-14 ENCOUNTER — Telehealth: Payer: Self-pay | Admitting: Family Medicine

## 2011-11-14 NOTE — Telephone Encounter (Signed)
Pt was put on lorazepam 1mg  for anxiety having side effects headaches. Pt is requesting xanax call into [facility] [place] [phone]

## 2011-11-14 NOTE — Telephone Encounter (Signed)
This should come from Dr. Cato Mulligan

## 2011-11-17 MED ORDER — ALPRAZOLAM 0.25 MG PO TABS: 0.2500 mg | ORAL_TABLET | Freq: Three times a day (TID) | ORAL | Status: DC | PRN

## 2011-11-17 NOTE — Telephone Encounter (Signed)
Discontinue lorazepam  Alprazolam 0.25 mg po every other day as needed for anxiety #20/ 3 refills

## 2011-11-17 NOTE — Telephone Encounter (Signed)
Notified pt. 

## 2011-12-01 NOTE — Progress Notes (Signed)
Summary: COUGH,CHEST AND BACK HURTS...WSE (Room 5)   Vital Signs:  Patient Profile:   45 Years Old Female CC:      cough, congestion and mid back pain x 1 week Height:     67.25 inches Weight:      170 pounds O2 Sat:      100 % O2 treatment:    Room Air Temp:     98.5 degrees F oral Pulse rate:   97 / minute Resp:     16 per minute BP sitting:   112 / 80  (left arm) Cuff size:   regular  Vitals Entered By: Lavell Islam RN (September 24, 2011 1:48 PM)                  Updated Prior Medication List: MULTIVITAMINS  CAPS (MULTIPLE VITAMIN)   Current Allergies (reviewed today): ! CITALOPRAM HYDROBROMIDE (CITALOPRAM HYDROBROMIDE)History of Present Illness History from: patient Chief Complaint: cough, congestion and mid back pain x 1 week History of Present Illness: 45 Years Old Female complains of onset of cold symptoms for a few days.  JAYDAH has been using nothing OTC.  SHe is requesting a steroid shot and cough meds. No sore throat + cough No pleuritic pain + wheezing in the AM +nasal congestion +post-nasal drainage No sinus pain/pressure + chest congestion No itchy/red eyes No earache No hemoptysis No SOB No chills/sweats No fever No nausea No vomiting No abdominal pain No diarrhea No skin rashes No fatigue No myalgias No headache   REVIEW OF SYSTEMS Constitutional Symptoms      Denies fever, chills, night sweats, weight loss, weight gain, and fatigue.  Eyes       Denies change in vision, eye pain, eye discharge, glasses, contact lenses, and eye surgery. Ear/Nose/Throat/Mouth       Complains of sore throat.      Denies hearing loss/aids, change in hearing, ear pain, ear discharge, dizziness, frequent runny nose, frequent nose bleeds, sinus problems, hoarseness, and tooth pain or bleeding.  Respiratory       Complains of dry cough, wheezing, shortness of breath, and bronchitis.      Denies productive cough, asthma, and emphysema/COPD.      Comments: hx of  bronchitis Cardiovascular       Complains of chest pain and tires easily with exhertion.      Denies murmurs.    Gastrointestinal       Denies stomach pain, nausea/vomiting, diarrhea, constipation, blood in bowel movements, and indigestion. Genitourniary       Denies painful urination, kidney stones, and loss of urinary control. Neurological       Denies paralysis, seizures, and fainting/blackouts. Musculoskeletal       Denies muscle pain, joint pain, joint stiffness, decreased range of motion, redness, swelling, muscle weakness, and gout.  Skin       Denies bruising, unusual mles/lumps or sores, and hair/skin or nail changes.  Psych       Denies mood changes, temper/anger issues, anxiety/stress, speech problems, depression, and sleep problems. Other Comments: cough, congestion and pain mid back x 1 week   Past History:  Past Medical History: Reviewed history from 12/21/2008 and no changes required. Allergic rhinitis Anxiety Depression Anemia-iron deficiency  2009 likely menorrhagia  Past Surgical History: Reviewed history from 08/02/2010 and no changes required. Panendoscopy Cholecystectomy  Family History: Reviewed history from 10/19/2006 and no changes required. Father-deceased 49 yo pneurmonia Mother- deceased Brain tumor  Social History: Reviewed history from 12/21/2008 and  no changes required. Occupation: "[employer]" Married Never Smoked Alcohol use-no Physical Exam General appearance: well developed, well nourished, no acute distress Ears: normal, no lesions or deformities Nasal: mucosa pink, nonedematous, no septal deviation, turbinates normal Oral/Pharynx: tongue normal, posterior pharynx without erythema or exudate Chest/Lungs: no rales, wheezes, or rhonchi bilateral, breath sounds equal without effort Heart: regular rate and  rhythm, no murmur MSE: oriented to time, place, and person Assessment New Problems: COUGH (ICD-786.2) UPPER RESPIRATORY  INFECTION, ACUTE (ICD-465.9)   Plan New Medications/Changes: ZITHROMAX Z-PAK 250 MG TABS (AZITHROMYCIN) use as directed  #1 x 0, 09/24/2011, Hoyt Koch MD TUSSIONEX PENNKINETIC ER 10-8 MG/5ML LQCR (HYDROCOD POLST-CHLORPHEN POLST) 5cc two times a day as needed for cough  #3oz x 0, 09/24/2011, Hoyt Koch MD  New Orders: Pulse Oximetry (single measurment) [94760] Rapid Strep [04540] Solumedrol up to 125mg  [J2930] Est. Patient Level III [98119] Admin of Therapeutic Inj  intramuscular or subcutaneous [96372] Planning Comments:   1)  Take the prescribed antibiotic as instructed.  Hold for a few days since this is likely viral. Should be better with cough meds and steroid shot that has helped her a lot in the past. 2)  Use nasal saline solution (over the counter) at least 3 times a day. 3)  Use over the counter decongestants like Zyrtec-D every 12 hours as needed to help with congestion. 4)  Can take tylenol every 6 hours or motrin every 8 hours for pain or fever. 5)  Follow up with your primary doctor  if no improvement in 5-7 days, sooner if increasing pain, fever, or new symptoms.    The patient and/or caregiver has been counseled thoroughly with regard to medications prescribed including dosage, schedule, interactions, rationale for use, and possible side effects and they verbalize understanding.  Diagnoses and expected course of recovery discussed and will return if not improved as expected or if the condition worsens. Patient and/or caregiver verbalized understanding.  Prescriptions: ZITHROMAX Z-PAK 250 MG TABS (AZITHROMYCIN) use as directed  #1 x 0   Entered and Authorized by:   Hoyt Koch MD   Signed by:   Hoyt Koch MD on 09/24/2011   Method used:   Print then Give to Patient   RxID:   [id number] Sandria Senter ER 10-8 MG/5ML LQCR (HYDROCOD POLST-CHLORPHEN POLST) 5cc two times a day as needed for cough  #3oz x 0   Entered and Authorized by:    Hoyt Koch MD   Signed by:   Hoyt Koch MD on 09/24/2011   Method used:   Print then Give to Patient   RxID:   [id number]   Medication Administration  Injection # 1:    Medication: Solumedrol up to 125mg     Diagnosis: COUGH (ICD-786.2)    Route: IM    Site: RUOQ gluteus    Exp Date: 04/2014    Lot #: [id number]    Mfr: pfizer    Comments: per Dr. Orson Aloe    Patient tolerated injection without complications    Given by: Lavell Islam RN (September 24, 2011 2:05 PM)  Orders Added: 1)  Pulse Oximetry (single measurment) [94760] 2)  Rapid Strep [53664] 3)  Solumedrol up to 125mg  [J2930] 4)  Est. Patient Level III [40347] 5)  Admin of Therapeutic Inj  intramuscular or subcutaneous [96372]     Laboratory Results  Date/Time Received: September 24, 2011 2:07 PM  Date/Time Reported: September 24, 2011 2:07 PM   Other Tests  Rapid Strep: negative  Kit  Test Internal QC: Negative   (Normal Range: Negative)

## 2011-12-01 NOTE — Telephone Encounter (Signed)
  Phone Note Outgoing Call Call back at [facility] Phone [phone]   Call placed by: Lajean Saver RN,  July 10, 2011 2:05 PM Call placed to: Patient Action Taken: Phone Call Completed Summary of Call: Callback: Patient reports the medication given to her for her back is helping. Negative urine culture given.

## 2011-12-01 NOTE — Progress Notes (Signed)
Summary: BACK PAIN   Vital Signs:  Patient Profile:   45 Years Old Female CC:      back Pain- right side Height:     67.25 inches Weight:      169 pounds O2 Sat:      98 % O2 treatment:    Room Air Temp:     98.3 degrees F oral Pulse rate:   72 / minute Resp:     16 per minute BP sitting:   117 / 79  (left arm) Cuff size:   regular  Pt. in pain?   yes    Location:   mid/lower right sided back     Type:       burn/throb  Vitals Entered By: Lajean Saver RN (July 08, 2011 11:21 AM)                   Updated Prior Medication List: MULTIVITAMINS  CAPS (MULTIPLE VITAMIN)   Current Allergies (reviewed today): ! CITALOPRAM HYDROBROMIDE (CITALOPRAM HYDROBROMIDE)History of Present Illness History from: patient Chief Complaint: back Pain- right side History of Present Illness: The patient presents today with R sided back pain for 3 months.  She was taking a weight lifting class and thinks this originally aggravated it.  She thought it was a UTI and saw her PCP who did a UA and it was neg.  Pain is fairly constant and mild.  She was reading on the internet and got concerned that it could be something worse.  No new meds. Worse with: lifting her daughter, sitting, twisting Better with: rest, ibuprofen Trauma: no Bladder/bowel incontinence: no Weakness: no Fever/chills: no Night pain: sometimes Unexplained weight loss: no Cancer/immunosuppression: no PMH of osteoporosis or chronic steroid use:  no  REVIEW OF SYSTEMS Constitutional Symptoms      Denies fever, chills, night sweats, weight loss, weight gain, and fatigue.  Eyes       Denies change in vision, eye pain, eye discharge, glasses, contact lenses, and eye surgery. Ear/Nose/Throat/Mouth       Denies hearing loss/aids, change in hearing, ear pain, ear discharge, dizziness, frequent runny nose, frequent nose bleeds, sinus problems, sore throat, hoarseness, and tooth pain or bleeding.  Respiratory       Denies dry cough,  productive cough, wheezing, shortness of breath, asthma, bronchitis, and emphysema/COPD.  Cardiovascular       Denies murmurs, chest pain, and tires easily with exhertion.    Gastrointestinal       Denies stomach pain, nausea/vomiting, diarrhea, constipation, blood in bowel movements, and indigestion. Genitourniary       Denies painful urination, blood or discharge from vagina, kidney stones, and loss of urinary control. Neurological       Denies paralysis, seizures, and fainting/blackouts. Musculoskeletal       Denies muscle pain, joint pain, joint stiffness, decreased range of motion, redness, swelling, muscle weakness, and gout.  Skin       Denies bruising, unusual mles/lumps or sores, and hair/skin or nail changes.  Psych       Denies mood changes, temper/anger issues, anxiety/stress, speech problems, depression, and sleep problems. Other Comments: back pain x 3 months. UA done @ PCPs office 1 month ago, normal. Denies any urinary symptoms   Past History:  Past Medical History: Reviewed history from 12/21/2008 and no changes required. Allergic rhinitis Anxiety Depression Anemia-iron deficiency  2009 likely menorrhagia  Past Surgical History: Reviewed history from 08/02/2010 and no changes required. Panendoscopy Cholecystectomy  Family History:  Reviewed history from 10/19/2006 and no changes required. Father-deceased 64 yo pneurmonia Mother- deceased Brain tumor  Social History: Reviewed history from 12/21/2008 and no changes required. Occupation: "[employer]" Married Never Smoked Alcohol use-no Physical Exam General appearance: well developed, well nourished, no acute distress Abdomen: soft, non-tender without obvious organomegaly Back: + palpable R sided lumbar paraspinal spasm is felt, no CVA or flank tenderness, FROM, SLR neg Skin: no rashes or lesions. MSE: oriented to time, place, and person Assessment New Problems: BACK PAIN  (ICD-724.5)   Plan New Medications/Changes: MELOXICAM 7.5 MG TABS (MELOXICAM) 1 by mouth two times a day for 1 week, then as needed afterwards  #30 x 0, 07/08/2011, Hoyt Koch MD FLEXERIL 10 MG TABS (CYCLOBENZAPRINE HCL) 1 by mouth at bedtime as needed for pain/spasm  #15 x 0, 07/08/2011, Hoyt Koch MD  New Orders: UA Dipstick w/o Micro (automated)  [81003] T-Culture, Urine [id number] Est. Patient Level IV [08657] Planning Comments:   UA is normal.  UCx is pending.  Due to her exam and how it started, I believe this is MSK pain that she reaggravates by lifting her daughter, working out, and at her seated job.  Will first treat with meds.  Encourage gentle ROM, massage.  Offered PT but she declined.  If worsening or not improivng in a few weeks, can consider further workup (U/S vs CT) to look for stone or other pathology vs referral to sports med or PT.   The patient and/or caregiver has been counseled thoroughly with regard to medications prescribed including dosage, schedule, interactions, rationale for use, and possible side effects and they verbalize understanding.  Diagnoses and expected course of recovery discussed and will return if not improved as expected or if the condition worsens. Patient and/or caregiver verbalized understanding.  Prescriptions: MELOXICAM 7.5 MG TABS (MELOXICAM) 1 by mouth two times a day for 1 week, then as needed afterwards  #30 x 0   Entered and Authorized by:   Hoyt Koch MD   Signed by:   Hoyt Koch MD on 07/08/2011   Method used:   Print then Give to Patient   RxID:   [id number] FLEXERIL 10 MG TABS (CYCLOBENZAPRINE HCL) 1 by mouth at bedtime as needed for pain/spasm  #15 x 0   Entered and Authorized by:   Hoyt Koch MD   Signed by:   Hoyt Koch MD on 07/08/2011   Method used:   Print then Give to Patient   RxID:   [id number]   Orders Added: 1)  UA Dipstick w/o Micro (automated)  [81003] 2)   T-Culture, Urine [[id number]] 3)  Est. Patient Level IV [64332]    Laboratory Results   Urine Tests  Date/Time Received: July 08, 2011 11:50 AM  Date/Time Reported: July 08, 2011 11:50 AM   Routine Urinalysis   Color: yellow Glucose: negative   (Normal Range: Negative) Bilirubin: negative   (Normal Range: Negative) Ketone: negative   (Normal Range: Negative) Spec. Gravity: 1.025   (Normal Range: 1.003-1.035) Blood: negative   (Normal Range: Negative) pH: 6.5   (Normal Range: 5.0-8.0) Protein: negative   (Normal Range: Negative) Urobilinogen: 0.2   (Normal Range: 0-1) Nitrite: negative   (Normal Range: Negative) Leukocyte Esterace: negative   (Normal Range: Negative)

## 2011-12-01 NOTE — Progress Notes (Signed)
Summary: sinus/tm   Vital Signs:  Patient Profile:   45 Years Old Female CC:      congestion, productive cough, runny nose x 3 weeks, sore in mouth Height:     67.25 inches Weight:      170 pounds O2 Sat:      97 % O2 treatment:    Room Air Temp:     97.8 degrees F oral Pulse rate:   91 / minute Resp:     16 per minute BP sitting:   119 / 79  (left arm) Cuff size:   regular  Vitals Entered By: Lajean Saver RN (March 30, 2011 11:36 AM)                  Updated Prior Medication List: IMITREX 100 MG TABS (SUMATRIPTAN SUCCINATE) Take 1 tablet by mouth once a day as needed  Current Allergies (reviewed today): ! CITALOPRAM HYDROBROMIDE (CITALOPRAM HYDROBROMIDE)History of Present Illness History from: patient Chief Complaint: congestion, productive cough, runny nose x 3 weeks, sore in mouth History of Present Illness: 45 Years Old Female complains of onset of cold symptoms for 2 weeks.  Makaiya has been using Zyrtec which is helping a little bit. + sore throat + ulcers in mouth No cough No pleuritic pain No wheezing + nasal congestion + post-nasal drainage No sinus pain/pressure No chest congestion No itchy/red eyes No earache No hemoptysis No SOB No chills/sweats No fever No nausea No vomiting No abdominal pain No diarrhea No skin rashes ? fatigue No myalgias No headache   REVIEW OF SYSTEMS Constitutional Symptoms      Denies fever, chills, night sweats, weight loss, weight gain, and fatigue.  Eyes       Denies change in vision, eye pain, eye discharge, glasses, contact lenses, and eye surgery. Ear/Nose/Throat/Mouth       Complains of frequent runny nose and sinus problems.      Denies hearing loss/aids, change in hearing, ear pain, ear discharge, dizziness, frequent nose bleeds, sore throat, hoarseness, and tooth pain or bleeding.      Comments: sore in mouth  Respiratory       Complains of productive cough.      Denies dry cough, wheezing, shortness of  breath, asthma, bronchitis, and emphysema/COPD.  Cardiovascular       Denies murmurs, chest pain, and tires easily with exhertion.    Gastrointestinal       Denies stomach pain, nausea/vomiting, diarrhea, constipation, blood in bowel movements, and indigestion. Genitourniary       Denies painful urination, kidney stones, and loss of urinary control. Neurological       Denies paralysis, seizures, and fainting/blackouts. Musculoskeletal       Denies muscle pain, joint pain, joint stiffness, decreased range of motion, redness, swelling, muscle weakness, and gout.  Skin       Denies bruising, unusual mles/lumps or sores, and hair/skin or nail changes.  Psych       Denies mood changes, temper/anger issues, anxiety/stress, speech problems, depression, and sleep problems. Other Comments: taken zyrtec and allegra   Past History:  Past Medical History: Reviewed history from 12/21/2008 and no changes required. Allergic rhinitis Anxiety Depression Anemia-iron deficiency  2009 likely menorrhagia  Past Surgical History: Reviewed history from 08/02/2010 and no changes required. Panendoscopy Cholecystectomy  Family History: Reviewed history from 10/19/2006 and no changes required. Father-deceased 29 yo pneurmonia Mother- deceased Brain tumor  Social History: Reviewed history from 12/21/2008 and no changes required. Occupation: "  [employer]" Married Never Smoked Alcohol use-no Physical Exam General appearance: well developed, well nourished, no acute distress Ears: normal, no lesions or deformities Nasal: mucosa pink, nonedematous, no septal deviation, turbinates normal Oral/Pharynx: ulcer L side of throat, about 1cm in size with multiple other smaller ulcers, no erythema Neck: mild tender LAD Chest/Lungs: no rales, wheezes, or rhonchi bilateral, breath sounds equal without effort Heart: regular rate and  rhythm, no murmur MSE: oriented to time, place, and  person Assessment New Problems: APHTHOUS ULCERS (ICD-528.2)   Plan New Orders: Est. Patient Level IV [40981] Pulse Oximetry (single measurment) [94760] Services provided After hours-Weekends-Holidays [99051] Solumedrol up to 125mg  [J2930] Rapid Strep [19147] Admin of Therapeutic Inj  intramuscular or subcutaneous [96372] Planning Comments:   1)  Take the prescribed antiviral as instructed.  Gave IM solumedrol as well.  If recurrent or not improving, should see PCP for further labs.  Possibly hand-foot-mouth vs dry mouth vs other viral infection. 2)  Use nasal saline solution (over the counter) at least 3 times a day. 3)  Use over the counter Zyrtec 4)  Can take tylenol for pain or fever. 5)  Follow up with your primary doctor  if no improvement in 5-7 days, sooner if increasing pain, fever, or new symptoms.    The patient and/or caregiver has been counseled thoroughly with regard to medications prescribed including dosage, schedule, interactions, rationale for use, and possible side effects and they verbalize understanding.  Diagnoses and expected course of recovery discussed and will return if not improved as expected or if the condition worsens. Patient and/or caregiver verbalized understanding.   Medication Administration  Injection # 1:    Medication: Solumedrol up to 125mg     Diagnosis: APHTHOUS ULCERS (ICD-528.2)    Route: IM    Site: LUOQ gluteus    Exp Date: 09/28/2013    Lot #: [id number]    Mfr: pfizer    Patient tolerated injection without complications    Given by: Lajean Saver RN (March 30, 2011 12:09 PM)  Orders Added: 1)  Est. Patient Level IV [82956] 2)  Pulse Oximetry (single measurment) [94760] 3)  Services provided After hours-Weekends-Holidays [99051] 4)  Solumedrol up to 125mg  [J2930] 5)  Rapid Strep [21308] 6)  Admin of Therapeutic Inj  intramuscular or subcutaneous [96372]    Laboratory Results  Date/Time Received: March 30, 2011 12:08 PM   Date/Time Reported: March 30, 2011 12:08 PM   Other Tests  Rapid Strep: negative  Kit Test Internal QC: Negative   (Normal Range: Negative)

## 2011-12-02 ENCOUNTER — Telehealth: Payer: Self-pay | Admitting: Internal Medicine

## 2011-12-02 NOTE — Telephone Encounter (Signed)
Pt informed and she states she is out until the 17- he will not let it get fill until the 17th

## 2011-12-02 NOTE — Telephone Encounter (Signed)
Pt has quit taking lorazepam and would like to take the xanax every day.

## 2011-12-02 NOTE — Telephone Encounter (Signed)
Take xanax every other day

## 2011-12-02 NOTE — Telephone Encounter (Signed)
Patient calls in today. She has concerns about her directions of Alprazolam. It states to take every other day. But, she says that she used to be on it everyday. Please advise and return her call. She would like the rx to read 1qd.      [facility]

## 2011-12-02 NOTE — Telephone Encounter (Signed)
Would be best for her to limit dose Take every other day

## 2011-12-02 NOTE — Telephone Encounter (Signed)
LMTCB

## 2011-12-03 ENCOUNTER — Emergency Department
Admission: EM | Admit: 2011-12-03 | Discharge: 2011-12-03 | Disposition: A | Discharge status: Home / Self Care | Payer: 59 | Source: Home / Self Care | Attending: Emergency Medicine | Admitting: Emergency Medicine

## 2011-12-03 ENCOUNTER — Encounter: Payer: Self-pay | Admitting: *Deleted

## 2011-12-03 DIAGNOSIS — J069 Acute upper respiratory infection, unspecified: Secondary | ICD-10-CM

## 2011-12-03 LAB — POCT INFLUENZA A/B
Influenza A, POC: NEGATIVE
Influenza B, POC: NEGATIVE

## 2011-12-03 MED ORDER — AZITHROMYCIN 250 MG PO TABS: ORAL_TABLET | ORAL | Status: AC

## 2011-12-03 MED ORDER — HYDROCODONE-HOMATROPINE 5-1.5 MG/5ML PO SYRP: 5.0000 mL | ORAL_SOLUTION | Freq: Four times a day (QID) | ORAL | Status: AC | PRN

## 2011-12-03 NOTE — ED Provider Notes (Addendum)
History     CSN: [id number] Arrival date & time: 12/03/2011 11:31 AM   First MD Initiated Contact with Patient 12/03/11 1129      Chief Complaint  Patient presents with  . Cough  . Fever    (Consider location/radiation/quality/duration/timing/severity/associated sxs/prior treatment) HPI Donna Hancock is a 45 y.o. female who complains of onset of cold symptoms for a few days.  No sore throat + cough No pleuritic pain No wheezing +nasal congestion + post-nasal drainage + sinus pain/pressure No chest congestion No itchy/red eyes No earache No hemoptysis No SOB No chills/sweats No fever No nausea No vomiting No abdominal pain No diarrhea No skin rashes No fatigue No myalgias No headache    Past Medical History  Diagnosis Date  . Panic attack   . Anxiety   . Anxiety     Past Surgical History  Procedure Date  . Cholecystectomy     Family History  Problem Relation Age of Onset  . Brain cancer Mother     History  Substance Use Topics  . Smoking status: Never Smoker   . Smokeless tobacco: Not on file  . Alcohol Use: No    OB History    Grav Para Term Preterm Abortions TAB SAB Ect Mult Living                  Review of Systems  Allergies  Citalopram hydrobromide  Home Medications   Current Outpatient Rx  Name Route Sig Dispense Refill  . ALPRAZOLAM 0.25 MG PO TABS Oral Take 1 tablet (0.25 mg total) by mouth 3 (three) times daily as needed for anxiety (one every other day prn for ). 20 tablet 3  . HEMOCYTE 324 MG PO TABS  TAKE ONE TABLET BY MOUTH TWICE DAILY 60 each 2  . ONE-DAILY MULTI VITAMINS PO TABS Oral Take 1 tablet by mouth daily.      Marland Kitchen PSEUDOEPHEDRINE-ACETAMINOPHEN 30-500 MG PO TABS Oral Take 2 tablets by mouth every 4 (four) hours as needed. For congestion     . SUMATRIPTAN SUCCINATE 100 MG PO TABS         BP 111/76  Pulse 81  Temp(Src) 98.7 F (37.1 C) (Oral)  Resp 18  Ht 5' 7.5" (1.715 m)  Wt 166 lb 12 oz (75.637 kg)  BMI 25.73  kg/m2  SpO2 99%  LMP 12/03/2011  Physical Exam  Nursing note and vitals reviewed. Constitutional: She is oriented to person, place, and time. She appears well-developed and well-nourished.  HENT:  Head: Normocephalic and atraumatic.  Right Ear: Tympanic membrane, external ear and ear canal normal.  Left Ear: Tympanic membrane, external ear and ear canal normal.  Nose: Mucosal edema and rhinorrhea present.  Mouth/Throat: Posterior oropharyngeal erythema present. No oropharyngeal exudate or posterior oropharyngeal edema.    Neck: Neck supple.  Cardiovascular: Regular rhythm and normal heart sounds.   Pulmonary/Chest: Effort normal and breath sounds normal. No respiratory distress.  Neurological: She is alert and oriented to person, place, and time.  Skin: Skin is warm and dry.  Psychiatric: She has a normal mood and affect. Her speech is normal.    ED Course  Procedures (including critical care time)   Labs Reviewed  POCT INFLUENZA A/B   No results found.   No diagnosis found.    MDM  1)  Take the prescribed antibiotic as instructed. The rapid flu test is negative 2)  Use nasal saline solution (over the counter) at least 3 times a day. 3)  Use over the counter decongestants like Zyrtec-D every 12 hours as needed to help with congestion.  If you have hypertension, do not take medicines with sudafed.  4)  Can take tylenol every 6 hours or motrin every 8 hours for pain or fever. 5)  Follow up with your primary doctor if no improvement in 5-7 days, sooner if increasing pain, fever, or new symptoms.     Lily Kocher, MD 12/03/11 1150  Lily Kocher, MD 12/03/11 [phone]

## 2011-12-03 NOTE — ED Notes (Signed)
Pt c/o fever, achy , and productive cough x 4 days. She has taken Mucinex.

## 2011-12-10 ENCOUNTER — Ambulatory Visit (INDEPENDENT_AMBULATORY_CARE_PROVIDER_SITE_OTHER): Payer: 59 | Admitting: Family Medicine

## 2011-12-10 ENCOUNTER — Telehealth: Payer: Self-pay | Admitting: Internal Medicine

## 2011-12-10 ENCOUNTER — Encounter: Payer: Self-pay | Admitting: Family Medicine

## 2011-12-10 VITALS — BP 120/80 | HR 85 | Temp 97.8°F | Wt 165.0 lb

## 2011-12-10 DIAGNOSIS — R5383 Other fatigue: Secondary | ICD-10-CM

## 2011-12-10 DIAGNOSIS — F411 Generalized anxiety disorder: Secondary | ICD-10-CM

## 2011-12-10 DIAGNOSIS — D649 Anemia, unspecified: Secondary | ICD-10-CM

## 2011-12-10 DIAGNOSIS — R300 Dysuria: Secondary | ICD-10-CM

## 2011-12-10 DIAGNOSIS — F419 Anxiety disorder, unspecified: Secondary | ICD-10-CM

## 2011-12-10 LAB — POCT URINALYSIS DIPSTICK
Bilirubin, UA: NEGATIVE
Ketones, UA: NEGATIVE
Leukocytes, UA: NEGATIVE
Protein, UA: NEGATIVE
Spec Grav, UA: 1.02

## 2011-12-10 LAB — CBC WITH DIFFERENTIAL/PLATELET
Basophils Relative: 0.5 % (ref 0.0–3.0)
Eosinophils Relative: 1.7 % (ref 0.0–5.0)
HCT: 35.6 % — ABNORMAL LOW (ref 36.0–46.0)
Hemoglobin: 12.1 g/dL (ref 12.0–15.0)
Lymphs Abs: 2 10*3/uL (ref 0.7–4.0)
MCV: 85.7 fl (ref 78.0–100.0)
Monocytes Absolute: 0.4 10*3/uL (ref 0.1–1.0)
Monocytes Relative: 6 % (ref 3.0–12.0)
Neutro Abs: 4.1 10*3/uL (ref 1.4–7.7)
Platelets: 331 10*3/uL (ref 150.0–400.0)
WBC: 6.6 10*3/uL (ref 4.5–10.5)

## 2011-12-10 MED ORDER — BUPROPION HCL ER (XL) 150 MG PO TB24: 150.0000 mg | ORAL_TABLET | ORAL | Status: DC

## 2011-12-10 MED ORDER — ALPRAZOLAM 0.25 MG PO TABS: 0.2500 mg | ORAL_TABLET | Freq: Every day | ORAL | Status: DC

## 2011-12-10 NOTE — Patient Instructions (Signed)

## 2011-12-10 NOTE — Telephone Encounter (Signed)
Pt would like rx for ALPRAZolam (XANAX) 0.25 MG tablet changed and LORazepam (ATIVAN) 1 MG tablet is not helping her she is having bad side affects, please contact

## 2011-12-10 NOTE — Progress Notes (Signed)
Subjective:    Patient ID: Donna Hancock, female    DOB: [birth year], 45 y.o.   MRN: [id number]  HPI Patient is a 45 year old white female, nonsmoker, and with complaints of fatigue and anxiety. Patient reports that last month Dr. Cato Mulligan put her on Xanax 0.25 mg every other day. She reportedly Misead the directions and has been taken the medication once daily. She is now somewhat anxious since she's been off the medication for several days. She had an old prescription of Ativan that she try to supplement with that's not working well.  Patient also reports a strong urinary odor that she's noticed for the past 2 days, following her menstrual cycle. She denies any vaginal discharge. No urinary frequency, urgency, no low back pain, or abdominal pain.  Patient has had issues with fatigue for several years. She has a history of iron deficiency anemia, and has had to take iron supplements. She is requesting to have her iron levels checked today. Denies any chest pain shortness of breath lightheadedness or dizziness, or edema   Review of Systems    as stated above  Past Medical History  Diagnosis Date  . Panic attack   . Anxiety   . Anxiety     History   Social History  . Marital Status: Married    Spouse Name: N/A    Number of Children: N/A  . Years of Education: N/A   Occupational History  . Not on file.   Social History Main Topics  . Smoking status: Never Smoker   . Smokeless tobacco: Not on file  . Alcohol Use: No  . Drug Use: No  . Sexually Active: Not on file   Other Topics Concern  . Not on file   Social History Narrative  . No narrative on file    Past Surgical History  Procedure Date  . Cholecystectomy     Family History  Problem Relation Age of Onset  . Brain cancer Mother     Allergies  Allergen Reactions  . Citalopram Hydrobromide     REACTION: feels spacey and has headaches    Current Outpatient Prescriptions on File Prior to Visit  Medication Sig  Dispense Refill  . HEMOCYTE 324 MG TABS TAKE ONE TABLET BY MOUTH TWICE DAILY  60 each  2  . Multiple Vitamin (MULTIVITAMIN) tablet Take 1 tablet by mouth daily.        . pseudoephedrine-acetaminophen (TYLENOL SINUS) 30-500 MG TABS Take 2 tablets by mouth every 4 (four) hours as needed. For congestion       . SUMAtriptan (IMITREX) 100 MG tablet        . ALPRAZolam (XANAX) 0.25 MG tablet Take 1 tablet (0.25 mg total) by mouth 3 (three) times daily as needed for anxiety (one every other day prn for ).  20 tablet  3  . HYDROcodone-homatropine (HYCODAN) 5-1.5 MG/5ML syrup Take 5 mLs by mouth every 6 (six) hours as needed for cough.  90 mL  0    BP 120/80  Pulse 85  Temp(Src) 97.8 F (36.6 C) (Oral)  Wt 165 lb (74.844 kg)  LMP 12/05/2012chart Objective:   Physical Exam Patient is alert and oriented in no acute distress. Neck: No thyromegaly masses or bruits lungs: Clear to auscultation. Cardiac: Regular rate and rhythm. Abdomen soft and nontender positive bowel sounds. Skin: Warm and dry. No cyanosis       Assessment & Plan:  Assessment: #1 fatigue, #2 anxiety, #3 iron deficiency anemia  Plan: Labs  today to include TSH serum iron CBC and UA. I will give her a temporary supply of her Xanax #5 tablets at 0.25 mg dose. I have advised her that if she runs out of her medication early, she will not get additional refills. Will also start her on Wellbutrin XL 150 mg one by mouth daily. She will followup here in 3 weeks and sooner when necessary

## 2011-12-11 LAB — BASIC METABOLIC PANEL
BUN: 12 mg/dL (ref 6–23)
CO2: 29 mEq/L (ref 19–32)
Chloride: 106 mEq/L (ref 96–112)
Creatinine, Ser: 0.8 mg/dL (ref 0.4–1.2)
Glucose, Bld: 121 mg/dL — ABNORMAL HIGH (ref 70–99)
Potassium: 4.2 mEq/L (ref 3.5–5.1)

## 2011-12-11 NOTE — Telephone Encounter (Signed)
Spoke with patient in the office.

## 2011-12-11 NOTE — Telephone Encounter (Signed)
Saw padonda yesterday

## 2011-12-12 ENCOUNTER — Telehealth: Payer: Self-pay | Admitting: Internal Medicine

## 2011-12-12 NOTE — Telephone Encounter (Signed)
Pt called req to get lab results that were drawn on Wednesday 12/10/11. Pls call.

## 2011-12-15 ENCOUNTER — Telehealth: Payer: Self-pay | Admitting: Internal Medicine

## 2011-12-15 ENCOUNTER — Encounter: Payer: Self-pay | Admitting: Family Medicine

## 2011-12-15 NOTE — Telephone Encounter (Signed)
Open in error

## 2011-12-15 NOTE — Telephone Encounter (Signed)
Patient's labs were normal. Follow-up with PCP and discontinue Wellbutrin.

## 2011-12-15 NOTE — Telephone Encounter (Signed)
Pt called again regarding blood work. Pt has to go to ER yesterday because of reaction from buPROPion (WELLBUTRIN XL) 150 MG 24 hr tablet   Please contact asap

## 2011-12-16 NOTE — Telephone Encounter (Signed)
Pt aware and verbalized understanding.  

## 2011-12-17 ENCOUNTER — Telehealth: Payer: Self-pay | Admitting: Internal Medicine

## 2011-12-17 NOTE — Telephone Encounter (Signed)
Pt called and said that the Wellbutrin is too strong and pt can not tolerate the stimulant that is in the med. Made pts anxiety worse. Pt is req to change med to Lexapro 10 mg. [facility] in [place] [phone]

## 2011-12-17 NOTE — Telephone Encounter (Signed)
Pt called back. States she really needs an answer as her anxiety is getting worse. Thanks.

## 2011-12-18 ENCOUNTER — Emergency Department (HOSPITAL_COMMUNITY)
Admission: EM | Admit: 2011-12-18 | Discharge: 2011-12-18 | Disposition: A | Discharge status: Home / Self Care | Payer: 59 | Attending: Emergency Medicine | Admitting: Emergency Medicine

## 2011-12-18 ENCOUNTER — Encounter (HOSPITAL_COMMUNITY): Payer: Self-pay

## 2011-12-18 DIAGNOSIS — IMO0002 Reserved for concepts with insufficient information to code with codable children: Secondary | ICD-10-CM | POA: Insufficient documentation

## 2011-12-18 DIAGNOSIS — R5381 Other malaise: Secondary | ICD-10-CM | POA: Insufficient documentation

## 2011-12-18 DIAGNOSIS — R5383 Other fatigue: Secondary | ICD-10-CM | POA: Insufficient documentation

## 2011-12-18 DIAGNOSIS — F411 Generalized anxiety disorder: Secondary | ICD-10-CM | POA: Insufficient documentation

## 2011-12-18 DIAGNOSIS — F419 Anxiety disorder, unspecified: Secondary | ICD-10-CM

## 2011-12-18 MED ORDER — LORAZEPAM 1 MG PO TABS
1.0000 mg | ORAL_TABLET | Freq: Once | ORAL | Status: AC
  Administered 2011-12-18: 1 mg via ORAL
  Filled 2011-12-18: qty 1

## 2011-12-18 NOTE — Telephone Encounter (Signed)
Refer to psychiatry. 

## 2011-12-18 NOTE — Telephone Encounter (Signed)
Pt called back this morning to check on status of getting a different med for anxiety. Pt has been informed that Dr Cato Mulligan is recommending a referral to psychiatry, but pt says that she does not have the money to see a psychiatrist because she would have to pay $150 up front, before she could even sch appt. Pt is wondering is she went to hosp, if there were therapist or counselors she could speak to there?

## 2011-12-18 NOTE — ED Provider Notes (Signed)
Medical screening examination/treatment/procedure(s) were performed by non-physician practitioner and as supervising physician I was immediately available for consultation/collaboration.    Nelia Shi, MD 12/18/11 [phone]

## 2011-12-18 NOTE — Telephone Encounter (Signed)
Pt is at [facility] ED right now.

## 2011-12-18 NOTE — ED Notes (Signed)
Pt stated that she will be seen by her PCP and follow up with referals as provided by PA

## 2011-12-18 NOTE — ED Provider Notes (Signed)
History     CSN: [id number]  Arrival date & time 12/18/11  0913   First MD Initiated Contact with Patient 12/18/11 [phone]      Chief Complaint  Patient presents with  . Anxiety    (Consider location/radiation/quality/duration/timing/severity/associated sxs/prior treatment) Patient is a 45 y.o. female presenting with anxiety. The history is provided by the patient.  Anxiety This is a recurrent problem. The current episode started 1 to 4 weeks ago. The problem occurs constantly. The problem has been gradually worsening. Associated symptoms include anorexia, fatigue and weakness. Pertinent negatives include no chills or fever. The symptoms are aggravated by nothing. She has tried nothing for the symptoms. The treatment provided no relief.  Pt states she was on wellbutrin, states she thinks she had a reaction and worsening of anxiety after she started taking it. Was seen in [place]. Told to stop it. States she is irritable, depressed, has no appetite, lost 10lb in the last 2 weeks. States tried to get appointment with a psychiatrist, but unable to pay for it. States she read on the internet that Lexapro is the "best drug out there." Requesting prescription. States she took a dose yesterday that was her sons. States she came here because she thought she would be able to see a psychiatrist here. Deneis SI or HI.  Past Medical History  Diagnosis Date  . Panic attack   . Anxiety   . Anxiety     Past Surgical History  Procedure Date  . Cholecystectomy     Family History  Problem Relation Age of Onset  . Brain cancer Mother     History  Substance Use Topics  . Smoking status: Never Smoker   . Smokeless tobacco: Not on file  . Alcohol Use: No    OB History    Grav Para Term Preterm Abortions TAB SAB Ect Mult Living                  Review of Systems  Constitutional: Positive for fatigue and unexpected weight change. Negative for fever and chills.  HENT: Negative.   Eyes: Negative.    Respiratory: Negative.   Cardiovascular: Negative.   Gastrointestinal: Positive for anorexia.  Genitourinary: Negative.   Musculoskeletal: Negative.   Skin: Negative.   Neurological: Positive for weakness.  Psychiatric/Behavioral: Positive for sleep disturbance, decreased concentration and agitation. Negative for suicidal ideas. The patient is nervous/anxious.     Allergies  Citalopram hydrobromide and Wellbutrin  Home Medications   Current Outpatient Rx  Name Route Sig Dispense Refill  . ALPRAZOLAM 0.25 MG PO TABS Oral Take 1 tablet (0.25 mg total) by mouth 3 (three) times daily as needed for anxiety (one every other day prn for ). 20 tablet 3  . HEMOCYTE 324 MG PO TABS  TAKE ONE TABLET BY MOUTH TWICE DAILY 60 each 2  . ONE-DAILY MULTI VITAMINS PO TABS Oral Take 1 tablet by mouth daily.      Marland Kitchen PSEUDOEPHEDRINE-ACETAMINOPHEN 30-500 MG PO TABS Oral Take 2 tablets by mouth every 4 (four) hours as needed. For congestion     . SUMATRIPTAN SUCCINATE 100 MG PO TABS         BP 125/85  Pulse 78  Temp(Src) 98.7 F (37.1 C) (Oral)  Resp 18  SpO2 98%  LMP 12/03/2011  Physical Exam  Nursing note and vitals reviewed. Constitutional: She is oriented to person, place, and time. She appears well-developed and well-nourished. No distress.  HENT:  Head: Normocephalic.  Eyes: Conjunctivae  are normal.  Neck: Neck supple.  Cardiovascular: Normal rate, regular rhythm and normal heart sounds.   Pulmonary/Chest: Effort normal and breath sounds normal. No respiratory distress.  Neurological: She is alert and oriented to person, place, and time.  Skin: Skin is warm and dry. No rash noted. No erythema.  Psychiatric: Her speech is normal and behavior is normal. Judgment and thought content normal. Her mood appears anxious. Cognition and memory are normal.    ED Course  Procedures (including critical care time)  Pt denies SI or HI. Anxiety and depression. Will d/c home with follow up with  outpatient psychiatrist    MDM          Lottie Mussel, PA 12/18/11 1014

## 2011-12-18 NOTE — ED Notes (Signed)
Pt c/o increase anxiety x2wks, states took welbutrin with no relief; denies SI/HI

## 2011-12-19 ENCOUNTER — Encounter: Payer: Self-pay | Admitting: Family Medicine

## 2011-12-19 ENCOUNTER — Ambulatory Visit (INDEPENDENT_AMBULATORY_CARE_PROVIDER_SITE_OTHER): Payer: 59 | Admitting: Family Medicine

## 2011-12-19 VITALS — BP 116/80 | HR 101 | Temp 98.1°F

## 2011-12-19 DIAGNOSIS — F419 Anxiety disorder, unspecified: Secondary | ICD-10-CM

## 2011-12-19 DIAGNOSIS — F341 Dysthymic disorder: Secondary | ICD-10-CM

## 2011-12-19 MED ORDER — ESCITALOPRAM OXALATE 10 MG PO TABS: 10.0000 mg | ORAL_TABLET | Freq: Every day | ORAL | Status: DC

## 2011-12-19 NOTE — Progress Notes (Signed)
Subjective:    Patient ID: Donna Hancock, female    DOB: 24-May-1966, 45 y.o.   MRN: [id number]  HPI 45 year old white female, nonsmoker, and after having an allergic reaction to Wellbutrin. She went to the emergency department a couple days ago, or feelings of anxiousness and jitteriness. Continue will be returned. She is requesting to start on Lexapro.  She's also requesting to go back to Ativan. Patient reports an increase in the amount of stress as she's having at home with her husband, financial issues, work stress, and children. She believes that she'll respond well to Lexapro because she has taken one of her son's pills. She denies any thoughts of suicide, no feelings of helplessness or hopelessness, naproxen 9.  Review of Systems  Constitutional: Negative.   Respiratory: Negative.   Cardiovascular: Negative.   Musculoskeletal: Negative.   Psychiatric/Behavioral: Positive for agitation. The patient is nervous/anxious.    Past Medical History  Diagnosis Date  . Panic attack   . Anxiety   . Anxiety     History   Social History  . Marital Status: Married    Spouse Name: N/A    Number of Children: N/A  . Years of Education: N/A   Occupational History  . Not on file.   Social History Main Topics  . Smoking status: Never Smoker   . Smokeless tobacco: Not on file  . Alcohol Use: No  . Drug Use: No  . Sexually Active: Not on file   Other Topics Concern  . Not on file   Social History Narrative  . No narrative on file    Past Surgical History  Procedure Date  . Cholecystectomy     Family History  Problem Relation Age of Onset  . Brain cancer Mother     Allergies  Allergen Reactions  . Wellbutrin (Bupropion Hcl) Other (See Comments)    aggitation and figity    Current Outpatient Prescriptions on File Prior to Visit  Medication Sig Dispense Refill  . ALPRAZolam (XANAX) 0.25 MG tablet Take 1 tablet (0.25 mg total) by mouth 3 (three) times daily as needed for  anxiety (one every other day prn for ).  20 tablet  3  . HEMOCYTE 324 MG TABS TAKE ONE TABLET BY MOUTH TWICE DAILY  60 each  2  . Multiple Vitamin (MULTIVITAMIN) tablet Take 1 tablet by mouth daily.        . pseudoephedrine-acetaminophen (TYLENOL SINUS) 30-500 MG TABS Take 2 tablets by mouth every 4 (four) hours as needed. For congestion       . SUMAtriptan (IMITREX) 100 MG tablet         Current Facility-Administered Medications on File Prior to Visit  Medication Dose Route Frequency Provider Last Rate Last Dose  . LORazepam (ATIVAN) tablet 1 mg  1 mg Oral Once Tatyana A Kirichenko, PA   1 mg at 12/18/11 1027    BP 116/80  Pulse 101  Temp(Src) 98.1 F (36.7 C) (Oral)  LMP 12/05/2012chart    Objective:   Physical Exam  Constitutional: She is oriented to person, place, and time. She appears well-nourished.  Neck: Normal range of motion. Neck supple. No thyromegaly present.  Cardiovascular: Normal rate, regular rhythm and normal heart sounds.   Pulmonary/Chest: Effort normal and breath sounds normal.  Neurological: She is alert and oriented to person, place, and time.  Skin: Skin is warm and dry.  Psychiatric: She has a normal mood and affect.       Appears slightly  anxious.          Assessment & Plan:  Assessment: Anxiety and depression  Plan: Lexapro 10 mg, one half tablet by mouth daily x4 days increase to one tablet by mouth daily on day 5. Since I just renewed her Xanax last week, I will not switch her to Ativan. She'll followup in 2 weeks her PCP, and we can discuss switch back to Ativan if indicated. Patient to call if symptoms worsen or persist recheck when necessary.

## 2011-12-19 NOTE — Patient Instructions (Signed)
1. Take 1/2 Lexapro a day x 4 days. Then 1 tab daily.   Anxiety and Panic Attacks Your caregiver has informed you that you are having an anxiety or panic attack. There may be many forms of this. Most of the time these attacks come suddenly and without warning. They come at any time of day, including periods of sleep, and at any time of life. They may be strong and unexplained. Although panic attacks are very scary, they are physically harmless. Sometimes the cause of your anxiety is not known. Anxiety is a protective mechanism of the body in its fight or flight mechanism. Most of these perceived danger situations are actually nonphysical situations (such as anxiety over losing a job). CAUSES  The causes of an anxiety or panic attack are many. Panic attacks may occur in otherwise healthy people given a certain set of circumstances. There may be a genetic cause for panic attacks. Some medications may also have anxiety as a side effect. SYMPTOMS  Some of the most common feelings are:  Intense terror.   Dizziness, feeling faint.   Hot and cold flashes.   Fear of going crazy.   Feelings that nothing is real.   Sweating.   Shaking.   Chest pain or a fast heartbeat (palpitations).   Smothering, choking sensations.   Feelings of impending doom and that death is near.   Tingling of extremities, this may be from over-breathing.   Altered reality (derealization).   Being detached from yourself (depersonalization).  Several symptoms can be present to make up anxiety or panic attacks. DIAGNOSIS  The evaluation by your caregiver will depend on the type of symptoms you are experiencing. The diagnosis of anxiety or panic attack is made when no physical illness can be determined to be a cause of the symptoms. TREATMENT  Treatment to prevent anxiety and panic attacks may include:  Avoidance of circumstances that cause anxiety.   Reassurance and relaxation.   Regular exercise.   Relaxation  therapies, such as yoga.   Psychotherapy with a psychiatrist or therapist.   Avoidance of caffeine, alcohol and illegal drugs.   Prescribed medication.  SEEK IMMEDIATE MEDICAL CARE IF:   You experience panic attack symptoms that are different than your usual symptoms.   You have any worsening or concerning symptoms.  Document Released: 12/15/2005 Document Revised: 08/27/2011 Document Reviewed: 04/18/2010 [facility] Patient Information 2012 [place], [place].

## 2011-12-20 ENCOUNTER — Emergency Department (HOSPITAL_COMMUNITY)
Admission: EM | Admit: 2011-12-20 | Discharge: 2011-12-20 | Disposition: A | Discharge status: Home / Self Care | Payer: 59 | Attending: Emergency Medicine | Admitting: Emergency Medicine

## 2011-12-20 ENCOUNTER — Encounter (HOSPITAL_COMMUNITY): Payer: Self-pay | Admitting: *Deleted

## 2011-12-20 DIAGNOSIS — F329 Major depressive disorder, single episode, unspecified: Secondary | ICD-10-CM

## 2011-12-20 DIAGNOSIS — IMO0002 Reserved for concepts with insufficient information to code with codable children: Secondary | ICD-10-CM | POA: Insufficient documentation

## 2011-12-20 DIAGNOSIS — R0602 Shortness of breath: Secondary | ICD-10-CM | POA: Insufficient documentation

## 2011-12-20 DIAGNOSIS — F410 Panic disorder [episodic paroxysmal anxiety] without agoraphobia: Secondary | ICD-10-CM | POA: Insufficient documentation

## 2011-12-20 DIAGNOSIS — F341 Dysthymic disorder: Secondary | ICD-10-CM | POA: Insufficient documentation

## 2011-12-20 DIAGNOSIS — F419 Anxiety disorder, unspecified: Secondary | ICD-10-CM

## 2011-12-20 DIAGNOSIS — R0789 Other chest pain: Secondary | ICD-10-CM | POA: Insufficient documentation

## 2011-12-20 DIAGNOSIS — Z79899 Other long term (current) drug therapy: Secondary | ICD-10-CM | POA: Insufficient documentation

## 2011-12-20 DIAGNOSIS — G4700 Insomnia, unspecified: Secondary | ICD-10-CM | POA: Insufficient documentation

## 2011-12-20 LAB — URINALYSIS, ROUTINE W REFLEX MICROSCOPIC
Bilirubin Urine: NEGATIVE
Glucose, UA: NEGATIVE mg/dL
Hgb urine dipstick: NEGATIVE
Ketones, ur: NEGATIVE mg/dL
Leukocytes, UA: NEGATIVE
Nitrite: NEGATIVE
Protein, ur: NEGATIVE mg/dL
Specific Gravity, Urine: 1.014 (ref 1.005–1.030)
Urobilinogen, UA: 0.2 mg/dL (ref 0.0–1.0)
pH: 5.5 (ref 5.0–8.0)

## 2011-12-20 LAB — BASIC METABOLIC PANEL
BUN: 7 mg/dL (ref 6–23)
Chloride: 100 mEq/L (ref 96–112)
GFR calc Af Amer: >90 mL/min (ref >90)
Glucose, Bld: 96 mg/dL (ref 70–99)
Potassium: 3.7 mEq/L (ref 3.5–5.1)
Sodium: 139 mEq/L (ref 135–145)

## 2011-12-20 LAB — DIFFERENTIAL
Basophils Relative: 1 % (ref 0–1)
Lymphs Abs: 2 10*3/uL (ref 0.7–4.0)
Monocytes Relative: 8 % (ref 3–12)
Neutro Abs: 5.4 10*3/uL (ref 1.7–7.7)
Neutrophils Relative %: 66 % (ref 43–77)

## 2011-12-20 LAB — RAPID URINE DRUG SCREEN, HOSP PERFORMED
Barbiturates: NOT DETECTED
Benzodiazepines: NOT DETECTED
Cocaine: NOT DETECTED
Opiates: NOT DETECTED

## 2011-12-20 LAB — CBC
HCT: 39 % (ref 36.0–46.0)
Hemoglobin: 13.3 g/dL (ref 12.0–15.0)
MCH: 28.6 pg (ref 26.0–34.0)
MCHC: 34.1 g/dL (ref 30.0–36.0)
MCV: 83.9 fL (ref 78.0–100.0)
Platelets: 304 10*3/uL (ref 150–400)
RBC: 4.65 MIL/uL (ref 3.87–5.11)
RDW: 13 % (ref 11.5–15.5)
WBC: 8.1 10*3/uL (ref 4.0–10.5)

## 2011-12-20 MED ORDER — FAMOTIDINE IN NACL 20-0.9 MG/50ML-% IV SOLN
20.0000 mg | Freq: Once | INTRAVENOUS | Status: AC
  Administered 2011-12-20: 20 mg via INTRAVENOUS
  Filled 2011-12-20: qty 50

## 2011-12-20 MED ORDER — GI COCKTAIL ~~LOC~~
30.0000 mL | Freq: Once | ORAL | Status: AC
  Administered 2011-12-20: 30 mL via ORAL
  Filled 2011-12-20: qty 30

## 2011-12-20 MED ORDER — LORAZEPAM 1 MG PO TABS: 1.0000 mg | ORAL_TABLET | Freq: Three times a day (TID) | ORAL | Status: DC | PRN

## 2011-12-20 MED ORDER — SODIUM CHLORIDE 0.9 % IV BOLUS (SEPSIS)
1000.0000 mL | Freq: Once | INTRAVENOUS | Status: AC
  Administered 2011-12-20: 1000 mL via INTRAVENOUS

## 2011-12-20 MED ORDER — LORAZEPAM 2 MG/ML IJ SOLN
1.0000 mg | Freq: Once | INTRAMUSCULAR | Status: AC
  Administered 2011-12-20: 1 mg via INTRAVENOUS
  Filled 2011-12-20: qty 1

## 2011-12-20 NOTE — BH Assessment (Signed)
Assessment Note   Donna Hancock is an 45 y.o. female. She is presenting to [facility] due to increased anxiety. She has been to Emergency rooms 3x's over the past 3 weeks. She explains that her anxiety symptoms started 3 months ago. She recently went back to school. She has 2 children and has the responsibility of picking her kids and spouse up throughout the week. She explains that her son and spouse both had cars that broke down. Pt reports feeling stressed and overwhelmed with responsibilities. She went to her PCP to seek help for her anxiety and was prescribed Welbutrin. She was having an allergic reaction to Wellbutrin, therefore; discontinued her use. Patient has been given Lexapro and sts she feels that it may work better for her anxiety. Patient explains that she has taken one of her sons pills previously and it made her feel better.  She denies any thoughts of SI and HI. No AVH notes. Patient denies alcohol and drug use.   Axis I: Anxiety Disorder NOS Axis II: Deferred Axis III:  Past Medical History  Diagnosis Date  . Panic attack   . Anxiety   . Anxiety   . Anxiety    Axis IV: economic problems, educational problems and housing problems, financial,  Axis V: 51-60 moderate symptoms  Past Medical History:  Past Medical History  Diagnosis Date  . Panic attack   . Anxiety   . Anxiety   . Anxiety     Past Surgical History  Procedure Date  . Cholecystectomy     Family History:  Family History  Problem Relation Age of Onset  . Brain cancer Mother     Social History:  reports that she has never smoked. She does not have any smokeless tobacco history on file. She reports that she does not drink alcohol or use illicit drugs.  Additional Social History:  Alcohol / Drug Use Pain Medications: patient denies Prescriptions: see meds listed by  [facility] staff Over the Counter: none reported History of alcohol / drug use?: No history of alcohol / drug abuse Longest period of sobriety  (when/how long): n/a Allergies:  Allergies  Allergen Reactions  . Celexa     Feels funny  . Wellbutrin (Bupropion Hcl) Other (See Comments)    aggitation and figity    Home Medications:  Medications Prior to Admission  Medication Dose Route Frequency Provider Last Rate Last Dose  . famotidine (PEPCID) IVPB 20 mg  20 mg Intravenous Once Dayton Bailiff, MD   20 mg at 12/20/11 1716  . gi cocktail  30 mL Oral Once Dayton Bailiff, MD   30 mL at 12/20/11 1716  . LORazepam (ATIVAN) injection 1 mg  1 mg Intravenous Once Dayton Bailiff, MD   1 mg at 12/20/11 1716  . sodium chloride 0.9 % bolus 1,000 mL  1,000 mL Intravenous Once Dayton Bailiff, MD   1,000 mL at 12/20/11 1715   Medications Prior to Admission  Medication Sig Dispense Refill  . ALPRAZolam (XANAX) 0.25 MG tablet Take 1 tablet (0.25 mg total) by mouth 3 (three) times daily as needed for anxiety (one every other day prn for ).  20 tablet  3  . HEMOCYTE 324 MG TABS TAKE ONE TABLET BY MOUTH TWICE DAILY  60 each  2  . Multiple Vitamin (MULTIVITAMIN) tablet Take 1 tablet by mouth daily.        . SUMAtriptan (IMITREX) 100 MG tablet Take 100 mg by mouth every 2 (two) hours as needed. For  migraines.usually only takes once a month around menstrual cycle.        OB/GYN Status:  Patient's last menstrual period was 12/03/2011.  General Assessment Data Location of Assessment: [facility] ED ACT Assessment: Yes Living Arrangements: Other (Comment) (lives with spouse and 2 children) Can pt return to current living arrangement?: Yes Admission Status: Voluntary Is patient capable of signing voluntary admission?: Yes Transfer from: Home Referral Source: Self/Family/Friend  Education Status Is patient currently in school?: Yes Name of school:  ([employer])  Risk to self Suicidal Ideation: No Suicidal Intent: No Is patient at risk for suicide?: No Suicidal Plan?: No Access to Means: No What has been your use of drugs/alcohol within the last 12 months?:  (no  alcohol or drug use noted) Previous Attempts/Gestures: No How many times?:  (n/a) Other Self Harm Risks:  (n/a) Triggers for Past Attempts: None known Intentional Self Injurious Behavior: None Family Suicide History: Unknown Recent stressful life event(s):  (recently started school,afraid to drive,burden on spouse) Persecutory voices/beliefs?: No Depression: No Depression Symptoms:  (n/a) Substance abuse history and/or treatment for substance abuse?: No Suicide prevention information given to non-admitted patients: Not applicable  Risk to Others Homicidal Ideation: No Thoughts of Harm to Others: No Current Homicidal Intent: No Current Homicidal Plan: No Access to Homicidal Means: No Identified Victim:  (n/a) History of harm to others?: No Assessment of Violence: None Noted Violent Behavior Description:  (n/a) Does patient have access to weapons?: No Criminal Charges Pending?: No Does patient have a court date: No  Psychosis Hallucinations: None noted Delusions: None noted  Mental Status Report Appear/Hygiene:  (neat and clean) Eye Contact: Good (none noted) Motor Activity: Unremarkable Speech: Slow (normal) Level of Consciousness: Alert Mood: Other (Comment) (relaxed) Affect:  (normal) Anxiety Level: Severe (daily panic attacks x3 months) Panic attack frequency:  (daily) Most recent panic attack:  (today) Thought Processes: Relevant;Coherent Judgement: Unimpaired Orientation: Person;Place;Time;Situation Obsessive Compulsive Thoughts/Behaviors: None  Cognitive Functioning Concentration: Normal Memory: Recent Intact;Remote Intact IQ: Average Insight: Poor Impulse Control: Good Appetite: Fair Weight Loss:  (n/a) Weight Gain:  (n/a) Sleep: Decreased Total Hours of Sleep:  (varies; recent episodes of panic attacks while asleep) Vegetative Symptoms: None  Prior Inpatient Therapy Prior Inpatient Therapy: No Prior Therapy Dates:  (n/a) Prior Therapy  Facilty/Provider(s):  (n/a) Reason for Treatment:  (n/a)  Prior Outpatient Therapy Prior Outpatient Therapy: No Prior Therapy Dates:  (n/a) Prior Therapy Facilty/Provider(s):  (n/a) Reason for Treatment:  (n/a)  ADL Screening (condition at time of admission) Patient's cognitive ability adequate to safely complete daily activities?: Yes Patient able to express need for assistance with ADLs?: Yes Independently performs ADLs?: Yes Weakness of Legs: None Weakness of Arms/Hands: None  Home Assistive Devices/Equipment Home Assistive Devices/Equipment: None    Abuse/Neglect Assessment (Assessment to be complete while patient is alone) Physical Abuse: Denies Verbal Abuse: Denies Sexual Abuse: Denies Exploitation of patient/patient's resources: Denies Self-Neglect: Denies Values / Beliefs Cultural Requests During Hospitalization: None Spiritual Requests During Hospitalization: None   Advance Directives (For Healthcare) Advance Directive: Patient does not have advance directive Nutrition Screen Unintentional weight loss greater than 10lbs within the last month: No Dysphagia: No Home Tube Feeding or Total Parenteral Nutrition (TPN): No Patient appears severely malnourished: No  Additional Information 1:1 In Past 12 Months?: No CIRT Risk: No Elopement Risk: No Does patient have medical clearance?: Yes     Disposition:  Disposition Disposition of Patient: Outpatient treatment;Referred to [facility], Holley Raring)  On Site Evaluation  by:   Reviewed with Physician:     Melynda Ripple [facility] 12/20/2011 8:06 PM

## 2011-12-20 NOTE — ED Notes (Signed)
Patient here with c/o being hot and cold and having panick attach for sometime and was recently put on lexapro yesterday.

## 2011-12-20 NOTE — ED Provider Notes (Signed)
History     CSN: [id number]  Arrival date & time 12/20/11  1517   First MD Initiated Contact with Patient 12/20/11 1642      Chief Complaint  Patient presents with  . Panic Attack    (Consider location/radiation/quality/duration/timing/severity/associated sxs/prior treatment) Patient is a 45 y.o. female presenting with mental health disorder. The history is provided by the patient. No language interpreter was used.  Mental Health Problem The primary symptoms include dysphoric mood. The primary symptoms do not include delusions or hallucinations. The current episode started more than 1 month ago. This is a chronic problem.  The onset of the illness is precipitated by emotional stress. The degree of incapacity that she is experiencing as a consequence of her illness is severe. Sequelae of the illness include an inability to work, harmed interpersonal relations and an inability to care for self. Additional symptoms of the illness include insomnia, appetite change and agitation. Additional symptoms of the illness do not include no anhedonia, no fatigue, no flight of ideas, no inflated self-esteem, no headaches or no abdominal pain. She does not admit to suicidal ideas. She does not have a plan to commit suicide. She does not contemplate harming herself. She has not already injured self. She does not contemplate injuring another person. She has not already  injured another person. Risk factors that are present for mental illness include a history of mental illness.    Past Medical History  Diagnosis Date  . Panic attack   . Anxiety   . Anxiety   . Anxiety     Past Surgical History  Procedure Date  . Cholecystectomy     Family History  Problem Relation Age of Onset  . Brain cancer Mother     History  Substance Use Topics  . Smoking status: Never Smoker   . Smokeless tobacco: Not on file  . Alcohol Use: No    OB History    Grav Para Term Preterm Abortions TAB SAB Ect Mult  Living                  Review of Systems  Constitutional: Positive for activity change and appetite change. Negative for fever, chills and fatigue.  HENT: Negative for congestion, sore throat, rhinorrhea, neck pain and neck stiffness.   Respiratory: Positive for chest tightness and shortness of breath. Negative for cough.   Cardiovascular: Negative for chest pain and palpitations.  Gastrointestinal: Negative for nausea, vomiting and abdominal pain.  Genitourinary: Negative for dysuria, urgency, frequency and flank pain.  Neurological: Negative for dizziness, weakness, light-headedness, numbness and headaches.  Psychiatric/Behavioral: Positive for dysphoric mood and agitation. Negative for hallucinations. The patient has insomnia.   All other systems reviewed and are negative.    Allergies  Celexa and Wellbutrin  Home Medications   Current Outpatient Rx  Name Route Sig Dispense Refill  . ALPRAZOLAM 0.25 MG PO TABS Oral Take 1 tablet (0.25 mg total) by mouth 3 (three) times daily as needed for anxiety (one every other day prn for ). 20 tablet 3  . ESCITALOPRAM OXALATE 10 MG PO TABS Oral Take 10 mg by mouth daily. New prescription. Take 1/2 tablet for 4 days, then start on 10 mg. Started on 12/21     . HEMOCYTE 324 MG PO TABS  TAKE ONE TABLET BY MOUTH TWICE DAILY 60 each 2  . ONE-DAILY MULTI VITAMINS PO TABS Oral Take 1 tablet by mouth daily.      . SUMATRIPTAN SUCCINATE 100 MG  PO TABS Oral Take 100 mg by mouth every 2 (two) hours as needed. For migraines.usually only takes once a month around menstrual cycle.    Marland Kitchen LORAZEPAM 1 MG PO TABS Oral Take 1 tablet (1 mg total) by mouth 3 (three) times daily as needed for anxiety. 6 tablet 0    BP 124/80  Pulse 92  Temp(Src) 98 F (36.7 C) (Oral)  Resp 20  SpO2 99%  LMP 12/03/2011  Physical Exam  Nursing note and vitals reviewed. Constitutional: She is oriented to person, place, and time. She appears well-developed and well-nourished.  She appears distressed.  HENT:  Head: Normocephalic and atraumatic.  Mouth/Throat: Oropharynx is clear and moist. No oropharyngeal exudate.  Eyes: Conjunctivae and EOM are normal. Pupils are equal, round, and reactive to light.  Neck: Normal range of motion. Neck supple.  Cardiovascular: Normal rate, regular rhythm, normal heart sounds and intact distal pulses.  Exam reveals no gallop and no friction rub.   No murmur heard. Pulmonary/Chest: Effort normal and breath sounds normal. No respiratory distress.  Abdominal: Soft. Bowel sounds are normal. There is no tenderness.  Musculoskeletal: Normal range of motion. She exhibits no tenderness.  Neurological: She is alert and oriented to person, place, and time. No cranial nerve deficit.  Skin: Skin is warm and dry. No rash noted.  Psychiatric: Her speech is normal. Her mood appears anxious. She is slowed and withdrawn. She exhibits a depressed mood. She expresses no homicidal and no suicidal ideation.    ED Course  Procedures (including critical care time)   Labs Reviewed  CBC  DIFFERENTIAL  BASIC METABOLIC PANEL  URINALYSIS, ROUTINE W REFLEX MICROSCOPIC  URINE RAPID DRUG SCREEN (HOSP PERFORMED)   No results found.   1. Anxiety   2. DEPRESSION   3. Panic disorder       MDM  Severe anxiety disorder with panic component. Patient has had numerous visits the past month therefore I had the ACT team evaluate her. They provided her with close outpatient followup. Laboratory studies were performed and unremarkable. I administered a dose of Ativan. She'll be prescribed a short course of Ativan and instructed to continue her Lexapro. Instructed to followup as provided by the ACT team        Dayton Bailiff, MD 12/20/11 1946

## 2011-12-24 ENCOUNTER — Telehealth: Payer: Self-pay | Admitting: *Deleted

## 2011-12-24 DIAGNOSIS — F329 Major depressive disorder, single episode, unspecified: Secondary | ICD-10-CM

## 2011-12-24 NOTE — Telephone Encounter (Signed)
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  Ok to stop xanax. Call pharmacy and discontinue all remaining prescriptions.   Lorazepam 1 mg , 1 po every other day as needed for anxiety. Not to exceed more than 15 per month. #15/ 0 refills. Make sure she has appointment with psychiatry.

## 2011-12-24 NOTE — Telephone Encounter (Signed)
Pt wants to switch from xanax to ativan 1 mg it helps and Dr Clent Ridges put her on the ativan.  She does not want to take xanax cause its addicting.  [facility]

## 2011-12-25 MED ORDER — LORAZEPAM 1 MG PO TABS: 1.0000 mg | ORAL_TABLET | ORAL | Status: AC | PRN

## 2011-12-25 NOTE — Telephone Encounter (Signed)
rx called in, pt aware.  Needs referral to therapy.  Wants to go to [facility].  Order placed

## 2011-12-26 ENCOUNTER — Ambulatory Visit: Payer: 59 | Admitting: Family Medicine

## 2012-01-02 ENCOUNTER — Ambulatory Visit: Payer: 59 | Admitting: Family

## 2012-01-20 ENCOUNTER — Other Ambulatory Visit: Payer: Self-pay | Admitting: Internal Medicine

## 2012-01-21 NOTE — Telephone Encounter (Signed)
Lorazepam was d/c on med list 01/04/12.  But she is also taking xanax.  Should she taking both?

## 2012-01-23 ENCOUNTER — Telehealth: Payer: Self-pay | Admitting: Internal Medicine

## 2012-01-23 NOTE — Telephone Encounter (Signed)
Pt called and said that [facility] in [place] sent over a refill req for LORazepam (ATIVAN) 1 MG tablet 3 days ago and there has not been a response. Pls call this med in today asap.

## 2012-01-26 MED ORDER — LORAZEPAM 0.5 MG PO TABS: 0.5000 mg | ORAL_TABLET | ORAL | Status: AC | PRN

## 2012-01-26 NOTE — Telephone Encounter (Signed)
Lorazepam was refused cause she was taking xanax, pt aware but she states that she never refilled the xanax cause the lorazepam helps her better than the xanax.  Do you want to refill the lorazepam?

## 2012-01-26 NOTE — Telephone Encounter (Signed)
rx called in, pt aware 

## 2012-01-26 NOTE — Telephone Encounter (Signed)
Ok to call in meds?  

## 2012-02-27 ENCOUNTER — Other Ambulatory Visit: Payer: Self-pay | Admitting: *Deleted

## 2012-02-27 DIAGNOSIS — F329 Major depressive disorder, single episode, unspecified: Secondary | ICD-10-CM

## 2012-02-27 MED ORDER — ESCITALOPRAM OXALATE 10 MG PO TABS: 10.0000 mg | ORAL_TABLET | Freq: Every day | ORAL | Status: DC

## 2012-09-29 ENCOUNTER — Other Ambulatory Visit: Payer: Self-pay | Admitting: Obstetrics and Gynecology

## 2012-09-29 HISTORY — PX: CERVICAL POLYPECTOMY: SHX88

## 2012-09-30 ENCOUNTER — Other Ambulatory Visit: Payer: Self-pay | Admitting: Internal Medicine

## 2012-10-04 ENCOUNTER — Emergency Department
Admission: EM | Admit: 2012-10-04 | Discharge: 2012-10-04 | Disposition: A | Discharge status: Home / Self Care | Payer: 59 | Source: Home / Self Care | Attending: Family Medicine | Admitting: Family Medicine

## 2012-10-04 ENCOUNTER — Encounter: Payer: Self-pay | Admitting: *Deleted

## 2012-10-04 DIAGNOSIS — J029 Acute pharyngitis, unspecified: Secondary | ICD-10-CM

## 2012-10-04 DIAGNOSIS — M940 Chondrocostal junction syndrome [Tietze]: Secondary | ICD-10-CM

## 2012-10-04 DIAGNOSIS — J069 Acute upper respiratory infection, unspecified: Secondary | ICD-10-CM

## 2012-10-04 LAB — POCT CBC W AUTO DIFF (K'VILLE URGENT CARE)

## 2012-10-04 MED ORDER — LIDOCAINE VISCOUS 2 % MT SOLN: 15.0000 mL | Freq: Three times a day (TID) | OROMUCOSAL | Status: DC

## 2012-10-04 MED ORDER — AMOXICILLIN 875 MG PO TABS: 875.0000 mg | ORAL_TABLET | Freq: Two times a day (BID) | ORAL | Status: DC

## 2012-10-04 NOTE — ED Notes (Signed)
Patient c/o pain in both sides of ribs x 5 days. Pain is intermittent and changes from left to right. She had polyps removed from uterus 5 days ago. She was intubated throughout the surgery. Since then she reports sore throat that has progressively worsened, painful to swallow, and white patch on her tongue. She called her surgeons office about the rib pain and sore throat. She was told to take the prescribed percocet and rest.

## 2012-10-04 NOTE — ED Provider Notes (Signed)
History     CSN: [id number]  Arrival date & time 10/04/12  0903   First MD Initiated Contact with Patient 10/04/12 [phone]      Chief Complaint  Patient presents with  . Sore Throat  . Chest Pain    RIB PAIN     HPI Comments: Patient c/o pain in both sides of ribs x 5 days. Pain is intermittent and changes from left to right. She had polyps removed from uterus 5 days ago. She was intubated throughout the surgery. Since then she reports sore throat that has progressively worsened, painful to swallow, and white patch on her tongue. She called her surgeons office about the rib pain and sore throat. She was told to take the prescribed percocet and rest.  The history is provided by the patient.    Past Medical History  Diagnosis Date  . Panic attack   . Anxiety   . Anxiety   . Anxiety     Past Surgical History  Procedure Date  . Cholecystectomy   . Cervical polypectomy 09/29/2012    Family History  Problem Relation Age of Onset  . Brain cancer Mother     History  Substance Use Topics  . Smoking status: Never Smoker   . Smokeless tobacco: Not on file  . Alcohol Use: No    OB History    Grav Para Term Preterm Abortions TAB SAB Ect Mult Living                  Review of Systems + sore throat No cough No pleuritic pain, but has bilateral rib pain No wheezing + nasal congestion + post-nasal drainage No sinus pain/pressure No itchy/red eyes No earache No hemoptysis No SOB No fever, + chills No nausea No vomiting + abdominal pain + diarrhea, resolved No urinary symptoms No skin rashes + fatigue No myalgias No headache Used OTC meds without relief  Allergies  Citalopram hydrobromide and Wellbutrin  Home Medications   Current Outpatient Rx  Name Route Sig Dispense Refill  . LORAZEPAM 0.5 MG PO TABS Oral Take 0.5 mg by mouth every 8 (eight) hours.    . OXYCODONE-ACETAMINOPHEN 10-325 MG PO TABS Oral Take 1 tablet by mouth every 4 (four) hours as needed.     . AMOXICILLIN 875 MG PO TABS Oral Take 1 tablet (875 mg total) by mouth 2 (two) times daily. 20 tablet 0  . ESCITALOPRAM OXALATE 10 MG PO TABS Oral Take 1 tablet (10 mg total) by mouth daily. 30 tablet 5  . HEMOCYTE 324 MG PO TABS  TAKE ONE TABLET BY MOUTH TWICE DAILY 60 each 2  . LIDOCAINE VISCOUS 2 % MT SOLN Oral Take 15 mLs by mouth 4 (four) times daily -  before meals and at bedtime. Gargle, then expectorate 200 mL 0  . ONE-DAILY MULTI VITAMINS PO TABS Oral Take 1 tablet by mouth daily.      Marland Kitchen OMEPRAZOLE 20 MG PO CPDR  TAKE ONE CAPSULE BY MOUTH EVERY DAY 30 capsule 1  . SUMATRIPTAN SUCCINATE 100 MG PO TABS Oral Take 100 mg by mouth every 2 (two) hours as needed. For migraines.usually only takes once a month around menstrual cycle.      BP 131/83  Pulse 83  Temp 98.9 F (37.2 C) (Oral)  Resp 16  Ht 5\' 7"  (1.702 m)  Wt 168 lb (76.204 kg)  BMI 26.31 kg/m2  SpO2 99%  LMP 09/10/2012  Physical Exam Nursing notes and Vital Signs  reviewed. Appearance:  Patient appears healthy, stated age, and in no acute distress Eyes:  Pupils are equal, round, and reactive to light and accomodation.  Extraocular movement is intact.  Conjunctivae are not inflamed  Ears:  Canals normal.  Tympanic membranes normal.  Nose:  Mildly congested turbinates.  No sinus tenderness.   Mouth:  Several 3 to 4mm dia apthous ulcers right buccal mucosae Pharynx:  Mildly erythematous without exudate Neck:  Supple.   Tender shotty posterior nodes are palpated bilaterally  Lungs:  Clear to auscultation.  Breath sounds are equal.  Chest:  Distinct tenderness to palpation over the mid-sternum.  Tenderness extends to lower ribs bilaterally.  Heart:  Regular rate and rhythm without murmurs, rubs, or gallops.  Abdomen:  Mild tenderness right upper quadrant without masses or hepatosplenomegaly.  Bowel sounds are present.  No CVA or flank tenderness.  Extremities:  No edema.  No calf tenderness Skin:  No rash present.   ED  Course  Procedures none   Labs Reviewed  POCT RAPID STREP A (OFFICE) negative  COMPLETE METABOLIC PANEL WITH GFR pending  POCT CBC W AUTO DIFF ([facility])    WBC 11.9; LY 12.0; MO 5.3; GR 82.7; Hgb 12.6; Platelets 206   STREP A DNA PROBE pending      1. Acute pharyngitis   2. Costochondritis, acute   3. Acute upper respiratory infections of unspecified site       MDM  Patients constellation of symptoms including costochondritis, pharyngitis, nasal congestion, posterior cervical adenopathy, cough, etc suggest a viral URI.  With mild leukocytosis, and history of recent surgical procedure, will empirically begin amoxicillin.   With mild right upper quadrant tenderness, will check CMP Followup with Family Doctor if not improved in one week.         Lattie Haw, MD 10/05/12 [phone]

## 2012-10-05 LAB — COMPLETE METABOLIC PANEL WITH GFR
BUN: 13 mg/dL (ref 6–23)
CO2: 29 mEq/L (ref 19–32)
Creat: 0.84 mg/dL (ref 0.50–1.10)
Glucose, Bld: 86 mg/dL (ref 70–99)
Sodium: 139 mEq/L (ref 135–145)
Total Bilirubin: 0.4 mg/dL (ref 0.3–1.2)
Total Protein: 7.4 g/dL (ref 6.0–8.3)

## 2012-10-06 ENCOUNTER — Telehealth: Payer: Self-pay | Admitting: *Deleted

## 2012-11-07 ENCOUNTER — Other Ambulatory Visit: Payer: Self-pay | Admitting: Internal Medicine

## 2013-02-14 ENCOUNTER — Ambulatory Visit (INDEPENDENT_AMBULATORY_CARE_PROVIDER_SITE_OTHER): Payer: Self-pay | Admitting: Family Medicine

## 2013-02-14 ENCOUNTER — Encounter: Payer: Self-pay | Admitting: Family Medicine

## 2013-02-14 VITALS — BP 108/80 | HR 97 | Temp 98.8°F | Wt 176.0 lb

## 2013-02-14 DIAGNOSIS — F411 Generalized anxiety disorder: Secondary | ICD-10-CM

## 2013-02-14 DIAGNOSIS — J019 Acute sinusitis, unspecified: Secondary | ICD-10-CM

## 2013-02-14 MED ORDER — LORAZEPAM 0.5 MG PO TABS: 0.5000 mg | ORAL_TABLET | Freq: Three times a day (TID) | ORAL | Status: DC | PRN

## 2013-02-14 MED ORDER — AMOXICILLIN 875 MG PO TABS: 875.0000 mg | ORAL_TABLET | Freq: Two times a day (BID) | ORAL | Status: DC

## 2013-02-14 NOTE — Progress Notes (Signed)
Chief Complaint  Patient presents with  . Headache    cheek bone sore, sore throat, white coated tongue, nausea     HPI:  Acute visit for headaches: -PMH migraines and anxiety -started: hx of migraines on R side, but has had sore throat, nasal congestion, sinus pressure and pain on R side in maxillary regain and she thinks she has a sinus infection, ulcer in mouth after using mouth wash - reports given lidocaine in the past for this - has this to use if needed, nausiea, cough and sneezing and drainage in throat, R maxillary sinus pain -denies: fevers, VD, SOB -hx of sinusitis and this feels like it has in the past  Anxiety: -wants refill on her ativan -stable, but husband lost job and has been using nightly   ROS: See pertinent positives and negatives per HPI.  Past Medical History  Diagnosis Date  . Panic attack   . Anxiety   . Anxiety   . Anxiety     Family History  Problem Relation Age of Onset  . Brain cancer Mother     History   Social History  . Marital Status: Married    Spouse Name: N/A    Number of Children: N/A  . Years of Education: N/A   Social History Main Topics  . Smoking status: Never Smoker   . Smokeless tobacco: None  . Alcohol Use: No  . Drug Use: No  . Sexually Active: None   Other Topics Concern  . None   Social History Narrative  . None    Current outpatient prescriptions:HEMOCYTE 324 MG TABS, TAKE ONE TABLET BY MOUTH TWICE DAILY, Disp: 60 each, Rfl: 2;  lidocaine (XYLOCAINE) 2 % solution, Take 15 mLs by mouth 4 (four) times daily -  before meals and at bedtime. Gargle, then expectorate, Disp: 200 mL, Rfl: 0;  LORazepam (ATIVAN) 0.5 MG tablet, Take 1 tablet (0.5 mg total) by mouth every 8 (eight) hours as needed for anxiety., Disp: 30 tablet, Rfl: 0 Multiple Vitamin (MULTIVITAMIN) tablet, Take 1 tablet by mouth daily.  , Disp: , Rfl: ;  omeprazole (PRILOSEC) 20 MG capsule, TAKE ONE CAPSULE BY MOUTH EVERY DAY, Disp: 30 capsule, Rfl: 1;   SUMAtriptan (IMITREX) 100 MG tablet, Take 100 mg by mouth every 2 (two) hours as needed. For migraines.usually only takes once a month around menstrual cycle., Disp: , Rfl:  SUMAtriptan (IMITREX) 100 MG tablet, TAKE ONE TABLET BY MOUTH EVERY DAY AS NEEDED, Disp: 6 tablet, Rfl: 4;  amoxicillin (AMOXIL) 875 MG tablet, Take 1 tablet (875 mg total) by mouth 2 (two) times daily., Disp: 20 tablet, Rfl: 0;  escitalopram (LEXAPRO) 10 MG tablet, Take 1 tablet (10 mg total) by mouth daily., Disp: 30 tablet, Rfl: 5 oxyCODONE-acetaminophen (PERCOCET) 10-325 MG per tablet, Take 1 tablet by mouth every 4 (four) hours as needed., Disp: , Rfl:   EXAM:  Filed Vitals:   02/14/13 1623  BP: 108/80  Pulse: 97  Temp: 98.8 F (37.1 C)    Body mass index is 27.56 kg/(m^2).  GENERAL: vitals reviewed and listed above, alert, oriented, appears well hydrated and in no acute distress  HEENT: atraumatic, conjunttiva clear, no obvious abnormalities on inspection of external nose and ears, normal appearance of ear canals and TMs, clear nasal congestion, mild post oropharyngeal erythema with PND, no tonsillar edema or exudate, R max sinus TTP  NECK: no obvious masses on inspection  LUNGS: clear to auscultation bilaterally, no wheezes, rales or rhonchi, good  air movement  CV: HRRR, no peripheral edema  MS: moves all extremities without noticeable abnormality  PSYCH: pleasant and cooperative, no obvious depression or anxiety  ASSESSMENT AND PLAN:  Discussed the following assessment and plan:  Acute sinusitis - Plan: amoxicillin (AMOXIL) 875 MG tablet  ANXIETY - Plan: LORazepam (ATIVAN) 0.5 MG tablet -will tx sinusitis with amoxicillin and saline irrigation -refill ativan for 1 month - advised needs follow up with PCP for further refills -Patient advised to return or notify a doctor immediately if symptoms worsen or persist or new concerns arise.  Patient Instructions  INSTRUCTIONS FOR UPPER RESPIRATORY  INFECTION:  -plenty of rest and fluids  -nasal saline wash 2-3 times daily (use prepackaged nasal saline or bottled/distilled water if making your own)   -can use sinex nasal spray for drainage and nasal congestion - but do NOT use longer then 3-4 days  -can use tylenol or ibuprofen as directed for aches and sorethroat  -in the winter time, using a humidifier at night is helpful (please follow cleaning instructions)  -if you are taking a cough medication - use only as directed, may also try a teaspoon of honey to coat the throat and throat lozenges  -for sore throat, salt water gargles can help  -follow up if you have fevers, facial pain, tooth pain, difficulty breathing or are worsening or not getting better in 5-7 days      Donna Hancock R.

## 2013-02-14 NOTE — Patient Instructions (Addendum)

## 2013-04-01 ENCOUNTER — Encounter: Payer: Self-pay | Admitting: Family Medicine

## 2013-04-01 ENCOUNTER — Ambulatory Visit (INDEPENDENT_AMBULATORY_CARE_PROVIDER_SITE_OTHER): Payer: 59 | Admitting: Family Medicine

## 2013-04-01 VITALS — BP 130/80 | HR 96 | Temp 99.0°F | Wt 175.0 lb

## 2013-04-01 DIAGNOSIS — J209 Acute bronchitis, unspecified: Secondary | ICD-10-CM

## 2013-04-01 DIAGNOSIS — F411 Generalized anxiety disorder: Secondary | ICD-10-CM

## 2013-04-01 MED ORDER — AZITHROMYCIN 250 MG PO TABS: ORAL_TABLET | ORAL | Status: DC

## 2013-04-01 MED ORDER — LORAZEPAM 0.5 MG PO TABS: 0.5000 mg | ORAL_TABLET | Freq: Three times a day (TID) | ORAL | Status: DC | PRN

## 2013-04-01 NOTE — Progress Notes (Signed)
  Subjective:    Patient ID: Donna Hancock, female    DOB: [birth year], 47 y.o.   MRN: [id number]  HPI Here for 2 things. First she has had chest congestion and coughing up green sputum for 3 days. No fever. Also she asks for refills on Ativan, which was given to her by Dr. Selena Batten. This works well for her.    Review of Systems  Constitutional: Negative.   HENT: Positive for postnasal drip. Negative for congestion and sinus pressure.   Eyes: Negative.   Respiratory: Positive for cough, chest tightness and shortness of breath.        Objective:   Physical Exam  Constitutional: She appears well-developed and well-nourished.  HENT:  Right Ear: External ear normal.  Left Ear: External ear normal.  Nose: Nose normal.  Mouth/Throat: Oropharynx is clear and moist.  Eyes: Conjunctivae are normal.  Pulmonary/Chest: Effort normal. No respiratory distress. She has no wheezes. She has no rales.  Scattered rhonchi   Lymphadenopathy:    She has no cervical adenopathy.          Assessment & Plan:  Treat with a Zpack and Mucinex. Refilled the Ativan.

## 2013-04-19 ENCOUNTER — Telehealth: Payer: Self-pay | Admitting: Family Medicine

## 2013-04-19 NOTE — Telephone Encounter (Signed)
Call in Hydromet to take 5 ml q 4 hours prn cough, 240 ml with no rf  

## 2013-04-19 NOTE — Telephone Encounter (Signed)
Pt was in on 4/4 w/ bronchitis. Pt states she is still coughing, and would like to know if you could call her something in for the cough. Otherwise, pt is feeling much better, just the continous cough. [facility]/ [place], [place]

## 2013-04-19 NOTE — Telephone Encounter (Signed)
Pls advise.  

## 2013-04-20 MED ORDER — HYDROCODONE-HOMATROPINE 5-1.5 MG/5ML PO SYRP: 5.0000 mL | ORAL_SOLUTION | ORAL | Status: DC | PRN

## 2013-04-20 NOTE — Telephone Encounter (Signed)
I called in script and spoke with pt. 

## 2013-07-13 ENCOUNTER — Telehealth: Payer: Self-pay | Admitting: Family Medicine

## 2013-07-13 DIAGNOSIS — F411 Generalized anxiety disorder: Secondary | ICD-10-CM

## 2013-07-13 NOTE — Telephone Encounter (Signed)
Donna Hancock is calling to see if her Lexapro could be changed; says she still is tired with decreased energy and having anxiety; has a friend that takes Adderrall 5mg  and it works well for her; wonders if she could try it; uses [employer]

## 2013-07-15 NOTE — Telephone Encounter (Signed)
Referral order placed, pt aware.  She does not want to go back to Dr Tomasa Rand

## 2013-07-15 NOTE — Telephone Encounter (Signed)
She needs to ask her doctor this (Dr. Cato Mulligan)

## 2013-07-15 NOTE — Telephone Encounter (Signed)
Probably not the best medication for her. I would  like her to see psychiatry. Please make that referral.

## 2013-08-17 ENCOUNTER — Telehealth: Payer: Self-pay | Admitting: Internal Medicine

## 2013-08-17 DIAGNOSIS — R1013 Epigastric pain: Secondary | ICD-10-CM

## 2013-08-17 NOTE — Telephone Encounter (Signed)
Pt would like referral to a GI doc. Pt has had pain in her stomach above navel, and pain goes across back. Bloating also. Pt does not have insurance and does not want to come in if she doesn't have to.  She has had pain 4-5 days Pt saw her OBGYN today and she said it sounds like diverticulitis or IBS. Pls advise.

## 2013-08-18 ENCOUNTER — Encounter: Payer: Self-pay | Admitting: Gastroenterology

## 2013-08-18 NOTE — Telephone Encounter (Signed)
Have her call [facility] for appt

## 2013-08-18 NOTE — Telephone Encounter (Signed)
Referral order placed.

## 2013-09-14 ENCOUNTER — Ambulatory Visit: Payer: 59 | Admitting: Gastroenterology

## 2013-09-23 ENCOUNTER — Telehealth: Payer: Self-pay | Admitting: Gastroenterology

## 2013-09-23 NOTE — Telephone Encounter (Signed)
Patient will have new insurance as of Wed 09/28/13.  She will come and see Willette Cluster RNP on 09/28/13 9:00

## 2013-09-28 ENCOUNTER — Other Ambulatory Visit (INDEPENDENT_AMBULATORY_CARE_PROVIDER_SITE_OTHER): Payer: 59

## 2013-09-28 ENCOUNTER — Encounter: Payer: Self-pay | Admitting: Nurse Practitioner

## 2013-09-28 ENCOUNTER — Ambulatory Visit (INDEPENDENT_AMBULATORY_CARE_PROVIDER_SITE_OTHER): Payer: 59 | Admitting: Nurse Practitioner

## 2013-09-28 VITALS — BP 110/80 | HR 64 | Ht 67.0 in | Wt 171.2 lb

## 2013-09-28 DIAGNOSIS — K589 Irritable bowel syndrome without diarrhea: Secondary | ICD-10-CM | POA: Insufficient documentation

## 2013-09-28 DIAGNOSIS — K219 Gastro-esophageal reflux disease without esophagitis: Secondary | ICD-10-CM

## 2013-09-28 DIAGNOSIS — R1084 Generalized abdominal pain: Secondary | ICD-10-CM

## 2013-09-28 DIAGNOSIS — R141 Gas pain: Secondary | ICD-10-CM

## 2013-09-28 DIAGNOSIS — R198 Other specified symptoms and signs involving the digestive system and abdomen: Secondary | ICD-10-CM

## 2013-09-28 DIAGNOSIS — R142 Eructation: Secondary | ICD-10-CM

## 2013-09-28 DIAGNOSIS — R194 Change in bowel habit: Secondary | ICD-10-CM

## 2013-09-28 LAB — COMPREHENSIVE METABOLIC PANEL
ALT: 18 U/L (ref 0–35)
Albumin: 4.2 g/dL (ref 3.5–5.2)
CO2: 30 mEq/L (ref 19–32)
Chloride: 105 mEq/L (ref 96–112)
GFR: 77.98 mL/min (ref >60.00)
Glucose, Bld: 85 mg/dL (ref 70–99)
Potassium: 3.9 mEq/L (ref 3.5–5.1)
Sodium: 139 mEq/L (ref 135–145)
Total Bilirubin: 0.8 mg/dL (ref 0.3–1.2)
Total Protein: 7.9 g/dL (ref 6.0–8.3)

## 2013-09-28 LAB — CBC WITH DIFFERENTIAL/PLATELET
Basophils Absolute: 0 10*3/uL (ref 0.0–0.1)
Eosinophils Relative: 1.7 % (ref 0.0–5.0)
Lymphocytes Relative: 24.6 % (ref 12.0–46.0)
Lymphs Abs: 1.9 10*3/uL (ref 0.7–4.0)
Monocytes Relative: 6.9 % (ref 3.0–12.0)
Neutrophils Relative %: 66.5 % (ref 43.0–77.0)
Platelets: 243 10*3/uL (ref 150.0–400.0)
RDW: 12.9 % (ref 11.5–14.6)
WBC: 7.8 10*3/uL (ref 4.5–10.5)

## 2013-09-28 MED ORDER — ESOMEPRAZOLE MAGNESIUM 40 MG PO CPDR: 40.0000 mg | DELAYED_RELEASE_CAPSULE | Freq: Every day | ORAL | Status: DC

## 2013-09-28 NOTE — Patient Instructions (Addendum)
  Please go to the basement level to have your labs drawn. We have given you anti reflux infromation. Take Align a probiotic for 1 month.  We have given you coupons for the Align.  You can get that at your pharmcy, [facility], [employer], [employer].   We sent a prescription for Nexium 40 mg take 1 daily to [facility]. We have given you a savings card  for the Nexium.

## 2013-09-28 NOTE — Progress Notes (Signed)
HPI :  Patient is a 47 year old female known remotely to Dr. Juanda Chance. She had an EGD in 2004 for evaluation of epigastric pain. Exam was normal.  Dondra Spry gives a history of chronically altered bowel habits. She gives a history of chronic anxiety. Over the last year she has developed episodic abdominal pain associated with loose stool. The abdominal pain is mid abdomen with radiation to right mid abdomen and around to her back. These episodes of pain often last for a couple of weeks during which time stools are loose. Once pain subsides and bowel movements return to baseline patient will felt fine until her next episode. She does relate the onset of these episodes with stressful situations. Stools are nonbloody. Her weight is stable.   Patient also reports recent development of reflux. A friend told her about Nexium and she started 20 mg of daily Nexium 2 days ago. She complains of a bad taste in her mouth in the morning times .she describes globus sensation as well   Past Medical History  Diagnosis Date  . Panic attack   . Anxiety   . Anemia    Family History  Problem Relation Age of Onset  . Brain cancer Mother   . Colon cancer Maternal Aunt    History  Substance Use Topics  . Smoking status: Never Smoker   . Smokeless tobacco: Never Used  . Alcohol Use: No   Current Outpatient Prescriptions  Medication Sig Dispense Refill  . LORazepam (ATIVAN) 0.5 MG tablet Take 1 tablet (0.5 mg total) by mouth every 8 (eight) hours as needed for anxiety.  60 tablet  5  . Multiple Vitamin (MULTIVITAMIN) tablet Take 1 tablet by mouth daily.        . SUMAtriptan (IMITREX) 100 MG tablet Take 100 mg by mouth every 2 (two) hours as needed. For migraines.usually only takes once a month around menstrual cycle.       No current facility-administered medications for this visit.   Allergies  Allergen Reactions  . Citalopram Hydrobromide     Feels funny  . Wellbutrin [Bupropion Hcl] Other (See Comments)   aggitation and figity    Review of Systems: Positive for allergy/sinus trouble, anxiety, back pain, depression, fatigue, headaches and sleeping problems. All other and systems reviewed and negative except where noted in HPI.    Physical Exam: BP 110/80  Pulse 64  Ht 5\' 7"  (1.702 m)  Wt 171 lb 3.2 oz (77.656 kg)  BMI 26.81 kg/m2 Constitutional: Pleasant,well-developed, white female in no acute distress. HEENT: Normocephalic and atraumatic. Conjunctivae are normal. No scleral icterus. Neck supple.  Cardiovascular: Normal rate, regular rhythm.  Pulmonary/chest: Effort normal and breath sounds normal. No wheezing, rales or rhonchi. Abdominal: Soft, nondistended, mild-moderate tenderness just above umbilicus. Bowel sounds active throughout. There are no masses palpable. No hepatomegaly. Extremities: no edema Lymphadenopathy: No cervical adenopathy noted. Neurological: Alert and oriented to person place and time. Skin: Skin is warm and dry. No rashes noted. Psychiatric: Normal mood and affect. Behavior is normal.   ASSESSMENT AND PLAN: 52.  47 year old female with chronically altered bowel habits, suspect underlying irritable bowel syndrome but now with a one-year history of abdominal pain and bowel habit changes. See #2  2. One-year history of episodic mid abdominal pain associated with loose stools. Episodes last for 2-3 weeks at a time . Patient feels completely back to baseline in between these episodes. She does relate these episodes to stress. No weight loss, looks healthy. Patient is  interested in trying probiotics before proceeding with further evaluation. She will begin daily Align and return in a month for follow up. Depending on clinical course she may colonoscopy to exclude colon pathology. Will check some basic labs today. Follow up in one month.   3. GERD, just started a few weeks ago. She started Nexium 20mg  daily a couple of days ago. I will increase to 40mg  daily before  breakfast. Anti-reflux measures discussed, written GERD literature given.   4. Chronic anxiety

## 2013-09-28 NOTE — Progress Notes (Signed)
Reviewed, check sprue profile, would she want to also try LevsinSL .125mg ?

## 2013-09-29 ENCOUNTER — Telehealth: Payer: Self-pay | Admitting: Internal Medicine

## 2013-09-29 ENCOUNTER — Telehealth: Payer: Self-pay | Admitting: *Deleted

## 2013-09-29 DIAGNOSIS — R109 Unspecified abdominal pain: Secondary | ICD-10-CM

## 2013-09-29 NOTE — Telephone Encounter (Signed)
It would not be unreasonable to proceed with colonoscopy given the one year history of bowel changes. Please schedule her for a colonoscopy. She will need a previsit I guess. Thanks

## 2013-09-29 NOTE — Telephone Encounter (Signed)
LM for the patient advising we cancelled the appointment with Dr. Russella Dar for 11-12-104 to Dr. Lina Sar on 11-08-2013 at 3:15 Pm.   We had to change the appointment because her last procedure was done by Dr. Lina Sar so we would have to schedule future appointments with Dr. Juanda Chance. I advised the patient to call us if she had any questions or problems with the appointment date and time.

## 2013-09-29 NOTE — Telephone Encounter (Signed)
Patient is calling because her husband did not think she should wait to schedule procedure. Please, advise if she should go ahead and schedule a procedure.

## 2013-09-30 NOTE — Addendum Note (Signed)
Addended by: Daphine Deutscher on: 09/30/2013 10:16 AM   Modules accepted: Orders

## 2013-09-30 NOTE — Telephone Encounter (Signed)
Patient scheduled for colonoscopy on 11/11/13 at 8:00 AM and 11/04/13 at 11:00 Am for Pre visit.

## 2013-10-04 ENCOUNTER — Telehealth: Payer: Self-pay | Admitting: Internal Medicine

## 2013-10-04 NOTE — Telephone Encounter (Signed)
Patient states she is having cramping, abdominal pain all across abdomen that goes to her back. States it is a twisting pain all day long. She is taking Nexium and probiotic. Bowels are moving ok. Please, advise.

## 2013-10-07 ENCOUNTER — Encounter: Payer: Self-pay | Admitting: Internal Medicine

## 2013-10-07 NOTE — Telephone Encounter (Signed)
Discussed with Rene Kocher, patient getting celiac labs and trying bowel antispasmodic.

## 2013-10-26 ENCOUNTER — Ambulatory Visit: Payer: 59 | Admitting: Gastroenterology

## 2013-11-03 ENCOUNTER — Other Ambulatory Visit: Payer: Self-pay

## 2013-11-04 ENCOUNTER — Ambulatory Visit (AMBULATORY_SURGERY_CENTER): Payer: Self-pay

## 2013-11-04 VITALS — Ht 68.0 in | Wt 170.0 lb

## 2013-11-04 DIAGNOSIS — R109 Unspecified abdominal pain: Secondary | ICD-10-CM

## 2013-11-04 MED ORDER — MOVIPREP 100 G PO SOLR: 1.0000 | Freq: Once | ORAL | Status: DC

## 2013-11-08 ENCOUNTER — Ambulatory Visit: Payer: 59 | Admitting: Internal Medicine

## 2013-11-09 ENCOUNTER — Ambulatory Visit: Payer: 59 | Admitting: Gastroenterology

## 2013-11-11 ENCOUNTER — Ambulatory Visit (AMBULATORY_SURGERY_CENTER): Payer: 59 | Admitting: Internal Medicine

## 2013-11-11 ENCOUNTER — Encounter: Payer: Self-pay | Admitting: Internal Medicine

## 2013-11-11 VITALS — BP 128/69 | HR 72 | Temp 98.1°F | Resp 55 | Ht 68.0 in | Wt 170.0 lb

## 2013-11-11 DIAGNOSIS — R109 Unspecified abdominal pain: Secondary | ICD-10-CM

## 2013-11-11 DIAGNOSIS — D126 Benign neoplasm of colon, unspecified: Secondary | ICD-10-CM

## 2013-11-11 MED ORDER — PB-HYOSCY-ATROPINE-SCOPOLAMINE 16.2 MG PO TABS: 1.0000 | ORAL_TABLET | Freq: Three times a day (TID) | ORAL | Status: DC

## 2013-11-11 MED ORDER — PB-HYOSCY-ATROPINE-SCOPOLAMINE 16.2 MG PO TABS: 1.0000 | ORAL_TABLET | Freq: Three times a day (TID) | ORAL | Status: DC | PRN

## 2013-11-11 MED ORDER — SODIUM CHLORIDE 0.9 % IV SOLN: 500.0000 mL | INTRAVENOUS | Status: DC

## 2013-11-11 NOTE — Progress Notes (Signed)
Results after 1st enema brown liquid w/ formed stool ,Dr Juanda Chance notified ,2nd enema given & results noted by Milford Cage to be more clear dark yellow. Dr Juanda Chance notified.

## 2013-11-11 NOTE — Progress Notes (Signed)
Called to room to assist during endoscopic procedure.  Patient ID and intended procedure confirmed with present staff. Received instructions for my participation in the procedure from the performing physician. ewm 

## 2013-11-11 NOTE — Progress Notes (Signed)
  [facility] Anesthesia Post-op Note  Patient: Donna Hancock  Procedure(s) Performed: colonoscopy  Patient Location: [facility] - Recovery Area  Anesthesia Type: Deep Sedation/Propofol  Level of Consciousness: awake, oriented and patient cooperative  Airway and Oxygen Therapy: Patient Spontanous Breathing  Post-op Pain: none  Post-op Assessment:  Post-op Vital signs reviewed, Patient's Cardiovascular Status Stable, Respiratory Function Stable, Patent Airway, No signs of Nausea or vomiting and Pain level controlled  Post-op Vital Signs: Reviewed and stable  Complications: No apparent anesthesia complications  Adalyna Godbee E 9:13 AM

## 2013-11-11 NOTE — Op Note (Signed)
[facility] [address] [place], [address]   COLONOSCOPY PROCEDURE REPORT  PATIENT: Donna Hancock, Donna Hancock  MR#: [id number] BIRTHDATE: August 15, 1966 , 46  yrs. old GENDER: Female ENDOSCOPIST: Hart Carwin, MD REFERRED QI:ONGEX Swords, M.D. PROCEDURE DATE:  11/11/2013 PROCEDURE:   Colonoscopy, diagnostic First Screening Colonoscopy - Avg.  risk and is 50 yrs.  old or older - No.  Prior Negative Screening - Now for repeat screening. N/A  History of Adenoma - Now for follow-up colonoscopy & has been > or = to 3 yrs.  N/A  Polyps Removed Today? Yes. ASA CLASS:   Class I INDICATIONS:abdominal pain in the lower right quadrant and Change in bowel habits. MEDICATIONS: MAC sedation, administered by CRNA and propofol (Diprivan) 200mg  IV  DESCRIPTION OF PROCEDURE:   After the risks benefits and alternatives of the procedure were thoroughly explained, informed consent was obtained.  A digital rectal exam revealed no abnormalities of the rectum.   The LB [id number]  endoscope was introduced through the anus and advanced to the cecum, which was identified by both the appendix and ileocecal valve. No adverse events experienced.   The quality of the prep was Prepopik fair The instrument was then slowly withdrawn as the colon was fully examined.      COLON FINDINGS: Three firm sessile polyps ranging between 3-33mm in size were found at the cecum.  A polypectomy was performed with cold forceps and with a cold snare.  The resection was complete and the polyp tissue was completely retrieved.  Retroflexed views revealed no abnormalities. The time to cecum=4 minutes 12 seconds. Withdrawal time=9 minutes 55 seconds.  The scope was withdrawn and the procedure completed. COMPLICATIONS: There were no complications.  ENDOSCOPIC IMPRESSION: Three sessile polyps ranging between 3-26mm in size were found at the cecum; polypectomy was performed with cold forceps and with a  cold snare  RECOMMENDATIONS: 1.  Await pathology results 2.   high fiber diet Continue probiotic one a day Use antispasmodics , Donnatal before meals when necessary Recall colonoscopy pending biopsies   eSigned:  Hart Carwin, MD 11/11/2013 9:13 AM   cc:   PATIENT NAME:  Rye, Dorado MR#: [id number]

## 2013-11-11 NOTE — Patient Instructions (Signed)

## 2013-11-11 NOTE — Progress Notes (Signed)
Pt says she only drank 1/2 of 1st part of prep yesterday ,last time had BM was last night @ 0730 and BM was liquid and had formed stool in it. Pt says she talked to Dr. On call last night. Has not had BM this morning. Dr Juanda Chance informed & in to talk with pt. Fleets enema given as ordered by Dr. Juanda Chance.Will check results .

## 2013-11-11 NOTE — Progress Notes (Signed)
Patient did not experience any of the following events: a burn prior to discharge; a fall within the facility; wrong site/side/patient/procedure/implant event; or a hospital transfer or hospital admission upon discharge from the facility. (G8907) Patient did not have preoperative order for IV antibiotic SSI prophylaxis. (G8918)  

## 2013-11-14 ENCOUNTER — Telehealth: Payer: Self-pay | Admitting: *Deleted

## 2013-11-14 ENCOUNTER — Telehealth: Payer: Self-pay | Admitting: Internal Medicine

## 2013-11-14 DIAGNOSIS — R109 Unspecified abdominal pain: Secondary | ICD-10-CM

## 2013-11-14 MED ORDER — PB-HYOSCY-ATROPINE-SCOPOLAMINE 16.2 MG PO TABS: 1.0000 | ORAL_TABLET | Freq: Three times a day (TID) | ORAL | Status: DC | PRN

## 2013-11-14 NOTE — Telephone Encounter (Signed)
  Follow up Call-  No flowsheet data found.   Patient questions:  Do you have a fever, pain , or abdominal swelling? no Pain Score  0 *  Have you tolerated food without any problems? yes  Have you been able to return to your normal activities? yes  Do you have any questions about your discharge instructions: Diet   no Medications  no Follow up visit  no  Do you have questions or concerns about your Care? no  Actions: * If pain score is 4 or above: No action needed, pain <4.   

## 2013-11-14 NOTE — Telephone Encounter (Signed)
Called pharmacy and script has not gone through Friday or this am. Script called in to pharmacy as per dr brodie's order on Friday for donnatal 16.2 mg TID with meals. Called number left by patient and left a message on number given that medicine was called in to the pharmacy [facility]. ewm

## 2013-11-15 ENCOUNTER — Telehealth: Payer: Self-pay | Admitting: *Deleted

## 2013-11-15 ENCOUNTER — Encounter: Payer: Self-pay | Admitting: Internal Medicine

## 2013-11-15 MED ORDER — HYOSCYAMINE SULFATE 0.125 MG SL SUBL: SUBLINGUAL_TABLET | SUBLINGUAL | Status: DC

## 2013-11-15 NOTE — Telephone Encounter (Signed)
Levnsin .125 mg, #60 1 SL ac prn abd. Pain, 1 refill

## 2013-11-15 NOTE — Telephone Encounter (Signed)
Per pharmacy, patient's Donnatal is going to cost $100.00. They would like an alternative medication.Marland KitchenMarland Kitchen

## 2013-11-28 ENCOUNTER — Other Ambulatory Visit: Payer: Self-pay | Admitting: Family Medicine

## 2013-11-28 ENCOUNTER — Telehealth: Payer: Self-pay | Admitting: Internal Medicine

## 2013-11-28 DIAGNOSIS — R109 Unspecified abdominal pain: Secondary | ICD-10-CM

## 2013-11-28 NOTE — Telephone Encounter (Signed)
Left a message for patient to call me. 

## 2013-11-28 NOTE — Telephone Encounter (Signed)
Call in #60 with 5 rf 

## 2013-11-28 NOTE — Telephone Encounter (Signed)
Sprue profile, sed. Rate, IgA,

## 2013-11-28 NOTE — Telephone Encounter (Signed)
Spoke with patient and gave her results of procedure as per letter by Dr. Juanda Chance. Patient is interested in having labs for gluten allergy. States she is still having abdominal pain that goes to her back. She woke up with it today. Please, advise.

## 2013-11-29 NOTE — Telephone Encounter (Signed)
Patient notified

## 2013-12-01 ENCOUNTER — Other Ambulatory Visit (INDEPENDENT_AMBULATORY_CARE_PROVIDER_SITE_OTHER): Payer: 59

## 2013-12-01 DIAGNOSIS — R109 Unspecified abdominal pain: Secondary | ICD-10-CM

## 2013-12-01 LAB — SEDIMENTATION RATE: Sed Rate: 17 mm/hr (ref 0–22)

## 2013-12-01 LAB — IGA: IgA: 476 mg/dL — ABNORMAL HIGH (ref 68–378)

## 2013-12-02 ENCOUNTER — Other Ambulatory Visit: Payer: Self-pay | Admitting: *Deleted

## 2013-12-02 ENCOUNTER — Telehealth: Payer: Self-pay | Admitting: *Deleted

## 2013-12-02 LAB — CELIAC PANEL 10: Gliadin IgG: 34.1 U/mL — ABNORMAL HIGH (ref <20)

## 2013-12-02 MED ORDER — PB-HYOSCY-ATROPINE-SCOPOLAMINE 16.2 MG PO TABS: ORAL_TABLET | ORAL | Status: DC

## 2013-12-02 NOTE — Telephone Encounter (Signed)
Let her get the prescription for Donnatal, and she can decide later if she wants to keep taking it. As far as serotonin is concerned Amitiza 8 ug po bid may be helpful in the future if Donnatal does not work

## 2013-12-02 NOTE — Telephone Encounter (Signed)
Rx faxed to pharmacy. Patient notified of Dr. Regino Schultze recommendations.

## 2013-12-02 NOTE — Telephone Encounter (Signed)
Patient states she had to labs yesterday to check for gluten. She states yesterday was a bad day with stomach and back pain. States it is on her right side above the belly button. She is having stomach and back cramps. She is taking Nexium and Hyoscyamine without relief. She states if the Donnatal would help her more she will pay the $100 for it. She is also asking about low serotonin levels possibly causing her pain.(she googled this on line) Please, advise.

## 2014-05-16 ENCOUNTER — Telehealth: Payer: Self-pay | Admitting: Internal Medicine

## 2014-05-16 NOTE — Telephone Encounter (Signed)
Spoke with patient and she states the Donnatal she is taking is not helping her constipation at this time. States her constipation is worse. She is asking for something else to take. Please, advise.

## 2014-05-16 NOTE — Telephone Encounter (Signed)
Donnatal is really not for constipation. Please try Linzes 145 ug, #30, 1 po qd, 1 refill. As an alternative she may try Miralax 9 gm po 3x/week or so.

## 2014-05-17 MED ORDER — LINACLOTIDE 145 MCG PO CAPS: 145.0000 ug | ORAL_CAPSULE | Freq: Every day | ORAL | Status: DC

## 2014-05-17 NOTE — Telephone Encounter (Signed)
Spoke with patient and she prefers to try Linzess. Rx sent to pharmacy.

## 2014-06-01 ENCOUNTER — Telehealth: Payer: Self-pay | Admitting: Internal Medicine

## 2014-06-01 NOTE — Telephone Encounter (Signed)
Ok to switch. DB

## 2014-06-01 NOTE — Telephone Encounter (Signed)
Pt states she would like to switch her care to Dr. Fuller Plan. Dr. Olevia Perches will you approve the switch? Please advise.

## 2014-06-02 ENCOUNTER — Other Ambulatory Visit: Payer: Self-pay | Admitting: Internal Medicine

## 2014-06-02 LAB — BASIC METABOLIC PANEL
BUN: 14 mg/dL (ref 4–21)
CREATININE: 0.8 mg/dL (ref 0.5–1.1)
GLUCOSE: 84 mg/dL
Potassium: 4 mmol/L (ref 3.4–5.3)
Sodium: 139 mmol/L (ref 137–147)

## 2014-06-02 LAB — TSH: TSH: 2.63 u[IU]/mL (ref 0.41–5.90)

## 2014-06-02 LAB — HEPATIC FUNCTION PANEL
ALT: 24 U/L (ref 7–35)
AST: 20 U/L (ref 13–35)
Alkaline Phosphatase: 33 U/L (ref 25–125)
Bilirubin, Total: 0.4 mg/dL

## 2014-06-02 LAB — CBC AND DIFFERENTIAL
HEMATOCRIT: 40 % (ref 36–46)
HEMOGLOBIN: 14.1 g/dL (ref 12.0–16.0)
WBC: 7 10^3/mL

## 2014-06-02 NOTE — Telephone Encounter (Signed)
Dr. Fuller Plan will you accept this pt? See below.

## 2014-06-05 ENCOUNTER — Other Ambulatory Visit: Payer: Self-pay | Admitting: Internal Medicine

## 2014-06-06 NOTE — Telephone Encounter (Signed)
Ladene Artist, MD Maury Dus, RN            OK, I will accept in transfer.      Please schedule this pt to see Dr. Fuller Plan.

## 2014-06-20 ENCOUNTER — Other Ambulatory Visit: Payer: Self-pay | Admitting: Family Medicine

## 2014-06-20 NOTE — Telephone Encounter (Signed)
This refill should be done by dr Leanne Chang who is stil the pcp  Last refill was given by dr Ames Dura to  [facility] for refills if needed

## 2014-06-23 ENCOUNTER — Other Ambulatory Visit: Payer: Self-pay | Admitting: Family Medicine

## 2014-07-05 ENCOUNTER — Ambulatory Visit (INDEPENDENT_AMBULATORY_CARE_PROVIDER_SITE_OTHER): Payer: 59 | Admitting: Family Medicine

## 2014-07-05 ENCOUNTER — Encounter: Payer: Self-pay | Admitting: Family Medicine

## 2014-07-05 VITALS — BP 114/75 | HR 74 | Temp 98.6°F | Ht 68.0 in | Wt 175.0 lb

## 2014-07-05 DIAGNOSIS — F3289 Other specified depressive episodes: Secondary | ICD-10-CM

## 2014-07-05 DIAGNOSIS — F329 Major depressive disorder, single episode, unspecified: Secondary | ICD-10-CM

## 2014-07-05 DIAGNOSIS — G43909 Migraine, unspecified, not intractable, without status migrainosus: Secondary | ICD-10-CM

## 2014-07-05 DIAGNOSIS — F411 Generalized anxiety disorder: Secondary | ICD-10-CM

## 2014-07-05 MED ORDER — LORAZEPAM 0.5 MG PO TABS: ORAL_TABLET | ORAL | Status: DC

## 2014-07-05 NOTE — Progress Notes (Signed)
Pre visit review using our clinic review tool, if applicable. No additional management support is needed unless otherwise documented below in the visit note. 

## 2014-07-05 NOTE — Progress Notes (Signed)
   Subjective:    Patient ID: Donna Hancock, female    DOB: [birth year], 48 y.o.   MRN: [id number]  HPI Here to follow up on anxiety. She uses Ativan daily and it works well for her. She sleeps well and her headaches have been well controlled.    Review of Systems  Constitutional: Negative.   Respiratory: Negative.   Cardiovascular: Negative.   Neurological: Negative.   Psychiatric/Behavioral: Negative.        Objective:   Physical Exam  Constitutional: She is oriented to person, place, and time. She appears well-developed and well-nourished.  Neurological: She is alert and oriented to person, place, and time.  Psychiatric: She has a normal mood and affect. Her behavior is normal. Thought content normal.          Assessment & Plan:  Refilled Ativan to use prn

## 2014-07-26 ENCOUNTER — Telehealth: Payer: Self-pay | Admitting: Internal Medicine

## 2014-07-26 NOTE — Telephone Encounter (Signed)
We can call in Zolpidem 10 mg qhs for #30 only. She needs to establish with another doctor for further refills

## 2014-07-26 NOTE — Telephone Encounter (Addendum)
Pt is a [facility] pt and saw dr fry 7/6 and wants to know if he will send her Ambien for sleep. Pt states she is having problems sleeping.  Encouraged pt to establish w/ [facility] or [facility]. Pt insisted I ask dr Sarajane Jews first bc she has to work. [facility]/ [address] [place]

## 2014-07-27 MED ORDER — ZOLPIDEM TARTRATE 10 MG PO TABS: 10.0000 mg | ORAL_TABLET | Freq: Every evening | ORAL | Status: DC | PRN

## 2014-07-27 NOTE — Telephone Encounter (Signed)
I called in script and spoke with pt. 

## 2014-08-22 ENCOUNTER — Other Ambulatory Visit: Payer: Self-pay

## 2014-08-23 LAB — CYTOLOGY - PAP

## 2015-01-30 ENCOUNTER — Other Ambulatory Visit: Payer: Self-pay | Admitting: Family Medicine

## 2015-01-30 NOTE — Telephone Encounter (Signed)
Call in #60 with 2 rf. She needs to establish with a new PCP

## 2015-02-12 ENCOUNTER — Other Ambulatory Visit: Payer: Self-pay | Admitting: Family Medicine

## 2015-05-07 ENCOUNTER — Telehealth: Payer: Self-pay | Admitting: Gastroenterology

## 2015-05-07 NOTE — Telephone Encounter (Signed)
Left message for patient to call back  

## 2015-05-07 NOTE — Telephone Encounter (Signed)
Patient advised will need an office visit and evaluation prior to ordering an [place].  She is scheduled to see Dr. Fuller Plan on 06/25/15

## 2015-06-18 ENCOUNTER — Telehealth: Payer: Self-pay | Admitting: Internal Medicine

## 2015-06-18 NOTE — Telephone Encounter (Signed)
See below

## 2015-06-18 NOTE — Telephone Encounter (Signed)
Pt has est appt 10/21, however, in one month  wil be out of  LORazepaSUMAtriptan (IMITREX) 100 MG tabletm  LORazepam (ATIVAN) 0.5 MG tablet  So you want pt to come in for fu on meds only or can you refill for 2 months [facility] [address]

## 2015-06-18 NOTE — Telephone Encounter (Signed)
I am ok with sumatriptan refill.   I am also ok with lorazepam refill. Please make sure patient knows that i do not use ativan on a daily basis for anxiety. My patient's that take that daily are either weaned off or referred to psychiatry.

## 2015-06-19 ENCOUNTER — Other Ambulatory Visit: Payer: Self-pay

## 2015-06-19 MED ORDER — SUMATRIPTAN SUCCINATE 100 MG PO TABS: 100.0000 mg | ORAL_TABLET | ORAL | Status: DC | PRN

## 2015-06-19 MED ORDER — LORAZEPAM 0.5 MG PO TABS: 0.5000 mg | ORAL_TABLET | Freq: Three times a day (TID) | ORAL | Status: DC | PRN

## 2015-06-19 NOTE — Telephone Encounter (Signed)
Refills sent in Mrs. Donna Hancock

## 2015-06-25 ENCOUNTER — Ambulatory Visit: Payer: 59 | Admitting: Gastroenterology

## 2015-07-09 ENCOUNTER — Ambulatory Visit: Payer: 59 | Admitting: Gastroenterology

## 2015-08-27 ENCOUNTER — Other Ambulatory Visit: Payer: Self-pay | Admitting: Family Medicine

## 2015-08-27 NOTE — Telephone Encounter (Signed)
Pt will be transfering care to you on 10/19/15 ok to refill now or wait until OV to discuss?

## 2015-08-27 NOTE — Telephone Encounter (Signed)
From last refill "I am also ok with lorazepam refill. Please make sure patient knows that i do not use ativan on a daily basis for anxiety. My patient's that take that daily are either weaned off or referred to psychiatry. " Make sure patient is aware of this. Also give her 2 refills only of 60 and let her know that is to last through her next visit so max 2 a day.

## 2015-08-28 ENCOUNTER — Telehealth: Payer: Self-pay

## 2015-08-28 NOTE — Telephone Encounter (Signed)
Script was sent in earlier, pt will be establishing with Dr. Yong Channel.

## 2015-08-28 NOTE — Telephone Encounter (Signed)
The pt called and is hoping to get a one month refill on her lorazepam rx.   Pharmacy - [facility] in [place]

## 2015-08-28 NOTE — Telephone Encounter (Signed)
Rx has been called in  

## 2015-09-04 ENCOUNTER — Other Ambulatory Visit: Payer: Self-pay | Admitting: Gynecology

## 2015-09-05 LAB — CYTOLOGY - PAP

## 2015-09-06 LAB — HM PAP SMEAR: HM PAP: NORMAL

## 2015-10-19 ENCOUNTER — Ambulatory Visit: Payer: 59 | Admitting: Family Medicine

## 2015-11-07 LAB — HM MAMMOGRAPHY: HM MAMMO: NORMAL

## 2015-11-13 ENCOUNTER — Ambulatory Visit (INDEPENDENT_AMBULATORY_CARE_PROVIDER_SITE_OTHER): Payer: 59 | Admitting: Family Medicine

## 2015-11-13 ENCOUNTER — Encounter: Payer: Self-pay | Admitting: Family Medicine

## 2015-11-13 VITALS — BP 104/78 | Temp 99.3°F | Ht 68.0 in | Wt 174.0 lb

## 2015-11-13 DIAGNOSIS — G43009 Migraine without aura, not intractable, without status migrainosus: Secondary | ICD-10-CM | POA: Diagnosis not present

## 2015-11-13 DIAGNOSIS — D126 Benign neoplasm of colon, unspecified: Secondary | ICD-10-CM | POA: Insufficient documentation

## 2015-11-13 DIAGNOSIS — F410 Panic disorder [episodic paroxysmal anxiety] without agoraphobia: Secondary | ICD-10-CM

## 2015-11-13 MED ORDER — SUMATRIPTAN SUCCINATE 100 MG PO TABS: 100.0000 mg | ORAL_TABLET | ORAL | Status: DC | PRN

## 2015-11-13 MED ORDER — LORAZEPAM 0.5 MG PO TABS: 0.5000 mg | ORAL_TABLET | Freq: Two times a day (BID) | ORAL | Status: DC | PRN

## 2015-11-13 NOTE — Progress Notes (Signed)
Garret Reddish, MD  Subjective:  Donna Hancock is a 49 y.o. year old very pleasant female patient who presents for/with See problem oriented charting ROS- No chest pain or shortness of breath. No headaches outside of once a month migraines or blurry vision.  Denies aura  Past Medical History-  Patient Active Problem List   Diagnosis Date Noted  . IBS (irritable bowel syndrome) 09/28/2013    Priority: Medium  . Panic attacks 09/07/2007    Priority: Medium  . Migraine headache 10/19/2006    Priority: Medium  . Tubular adenoma of colon     Priority: Low  . GERD (gastroesophageal reflux disease) 09/28/2013    Priority: Low  . Allergic rhinitis 09/07/2007    Priority: Low    Medications- reviewed and updated Current Outpatient Prescriptions  Medication Sig Dispense Refill  . Multiple Vitamin (MULTIVITAMIN) tablet Take 1 tablet by mouth daily.      Marland Kitchen LORazepam (ATIVAN) 0.5 MG tablet Take 1 tablet (0.5 mg total) by mouth 2 (two) times daily as needed for anxiety or sleep. for anxiety 60 tablet 2  . SUMAtriptan (IMITREX) 100 MG tablet Take 1 tablet (100 mg total) by mouth every 2 (two) hours as needed. For migraines.usually only takes once a month around menstrual cycle. 10 tablet 5   No current facility-administered medications for this visit.    Objective: BP 104/78 mmHg  Temp(Src) 99.3 F (37.4 C)  Ht 5\' 8"  (1.727 m)  Wt 174 lb (78.926 kg)  BMI 26.46 kg/m2 Gen: NAD, resting comfortably CV: RRR no murmurs rubs or gallops Lungs: CTAB no crackles, wheeze, rhonchi Abdomen: soft/nontender/nondistended/normal bowel sounds. No rebound or guarding.  Ext: no edema Skin: warm, dry Neuro: grossly normal, moves all extremities  Assessment/Plan:  Panic attacks S:Wakes up with panic attacks if does not take ativan nightly (sometimes just 1/2 pill). Takes nightly. Does not take in daytime unless has rare panic attack. Helps with insomnia. Trouble sleeping- stopped ambien due to slightly  foggy memory. Melatonin instead but more trouble since daylight savings. Did not tolerate zoloft or wellbutrin and wellbutrin led her to go to hospital due to increased anxiety.  A/P: daily rx benzo not ideal but is controlling symptoms but no issues next day or falls at night- continue for now   Migraine headache S:Imitrex 100 mg prn usually once a month aborts symptoms A/P: continue current rx  6 months  Meds ordered this encounter  Medications  . LORazepam (ATIVAN) 0.5 MG tablet    Sig: Take 1 tablet (0.5 mg total) by mouth 2 (two) times daily as needed for anxiety or sleep. for anxiety    Dispense:  60 tablet    Refill:  2  . SUMAtriptan (IMITREX) 100 MG tablet    Sig: Take 1 tablet (100 mg total) by mouth every 2 (two) hours as needed. For migraines.usually only takes once a month around menstrual cycle.    Dispense:  10 tablet    Refill:  5

## 2015-11-13 NOTE — Patient Instructions (Addendum)
Sign release of information at the front desk for labs from GYN within 3 years.  Refilled ativan to help avoid panic attacks at nighttime and for sleep. Since you mainly take at night- should last at least 6 months. See me before you run out  Refilled migraine medicine as well- glad you are not having them often  See you in 6 months or so!

## 2015-11-13 NOTE — Assessment & Plan Note (Signed)
S:Wakes up with panic attacks if does not take ativan nightly (sometimes just 1/2 pill). Takes nightly. Does not take in daytime unless has rare panic attack. Helps with insomnia. Trouble sleeping- stopped ambien due to slightly foggy memory. Melatonin instead but more trouble since daylight savings. Did not tolerate zoloft or wellbutrin and wellbutrin led her to go to hospital due to increased anxiety.  A/P: daily rx benzo not ideal but is controlling symptoms but no issues next day or falls at night- continue for now

## 2015-11-13 NOTE — Assessment & Plan Note (Signed)
S:Imitrex 100 mg prn usually once a month aborts symptoms A/P: continue current rx

## 2015-11-26 ENCOUNTER — Telehealth: Payer: Self-pay | Admitting: Family Medicine

## 2015-11-26 ENCOUNTER — Encounter: Payer: Self-pay | Admitting: Family Medicine

## 2015-11-26 NOTE — Telephone Encounter (Signed)
See below

## 2015-11-26 NOTE — Telephone Encounter (Signed)
Do not recall this conversation. Can you please provide more details on how long this has been going on? Level of pain? Any other symptoms? Where it hurts?

## 2015-11-26 NOTE — Telephone Encounter (Signed)
Patient saw the doctor for a pain on right rib, but now the pain has gone all the way around her back.  The doctor said for her to call if the pain got worse to schedule a xray.

## 2015-11-27 NOTE — Telephone Encounter (Signed)
Pt states this has been going on x8 months she got prevacid yesterday thinking that would help the pain under her rib which has eased off and she wanted to see if she can get an [place] or xray if the pain persist. She does not have her gallbladder which was removed in 2008. Pt states her pain yesterday was a 8/10 before taking prevacid and today she rates it 2/10. Dull pain in her right rib cage comes and goes. She states she does find great relief in prevacid.

## 2015-11-27 NOTE — Telephone Encounter (Signed)
We may potentially do that. Given improvement on prevacid would trial at least a week of consistent use. i would advise visit to discuss next steps if issue continues or does not respond to prevacid

## 2015-11-27 NOTE — Telephone Encounter (Signed)
Pt.notified

## 2016-01-14 ENCOUNTER — Other Ambulatory Visit: Payer: Self-pay | Admitting: Family Medicine

## 2016-01-14 NOTE — Telephone Encounter (Signed)
Yes thanks 

## 2016-01-14 NOTE — Telephone Encounter (Signed)
Refill ok? 

## 2016-01-24 ENCOUNTER — Emergency Department (INDEPENDENT_AMBULATORY_CARE_PROVIDER_SITE_OTHER)
Admission: EM | Admit: 2016-01-24 | Discharge: 2016-01-24 | Disposition: A | Discharge status: Home / Self Care | Payer: 59 | Source: Home / Self Care | Attending: Family Medicine | Admitting: Family Medicine

## 2016-01-24 DIAGNOSIS — J209 Acute bronchitis, unspecified: Secondary | ICD-10-CM | POA: Diagnosis not present

## 2016-01-24 MED ORDER — GUAIFENESIN-CODEINE 100-10 MG/5ML PO SOLN: ORAL | Status: DC

## 2016-01-24 MED ORDER — AZITHROMYCIN 250 MG PO TABS: ORAL_TABLET | ORAL | Status: DC

## 2016-01-24 NOTE — ED Provider Notes (Signed)
CSN: [id number]     Arrival date & time 01/24/16  1846 History   First MD Initiated Contact with Patient 01/24/16 1941     Chief Complaint  Patient presents with  . Cough      HPI Comments: Patient developed a non-productive cough one week ago, followed by sore throat and sinus congestion three days ago.  Yesterday she developed a headache and chills She has a history of pneumonia about  6 to 8 years ago.                                         The history is provided by the patient.    Past Medical History  Diagnosis Date  . Panic attack   . Anxiety   . Tubular adenoma of colon     11/11/13 with 5 year repeat  . 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 Cholecystectomy    . Cervical polypectomy  09/29/2012  . Ablation      uterine   Family History  Problem Relation Age of Onset  . Brain cancer Mother     Tumor on brainstem- not genetic  . Colon cancer Maternal Aunt   . Pancreatic cancer Neg Hx   . Stomach cancer Neg Hx    Social History  Substance Use Topics  . Smoking status: Never Smoker   . Smokeless tobacco: Never Used  . Alcohol Use: 0.0 oz/week    0 Standard drinks or equivalent per week     Comment: rare wine   OB History    No data available     Review of Systems + sore throat + cough + sneezing No pleuritic pain No wheezing + nasal congestion + post-nasal drainage + sinus pain/pressure No itchy/red eyes ? earache No hemoptysis No SOB No fever, + chills No nausea No vomiting No abdominal pain No diarrhea No urinary symptoms No skin rash + fatigue No myalgias + headache Used OTC meds without relief  Allergies  Citalopram hydrobromide and Wellbutrin  Home Medications   Prior to Admission medications   Medication Sig Start Date End Date Taking? Authorizing Provider  azithromycin (ZITHROMAX Z-PAK) 250 MG tablet Take 2 tabs  today; then begin one tab once daily for 4 more days. 01/24/16   Kandra Nicolas, MD  guaiFENesin-codeine 100-10 MG/5ML syrup Take 90mL by mouth at bedtime as needed for cough 01/24/16   Kandra Nicolas, MD  LORazepam (ATIVAN) 0.5 MG tablet TAKE ONE TABLET BY MOUTH EVERY 8 HOURS AS NEEDED FOR ANXIETY 01/14/16   Marin Olp, MD  Multiple Vitamin (MULTIVITAMIN) tablet Take 1 tablet by mouth daily.      Historical Provider, MD  SUMAtriptan (IMITREX) 100 MG tablet Take 1 tablet (100 mg total) by mouth every 2 (two) hours as needed. For migraines.usually only takes once a month around menstrual cycle. 11/13/15   Marin Olp, MD   Meds Ordered and Administered this Visit  Medications - No data to display  BP 126/77 mmHg  Pulse 80  Temp(Src) 98.1 F (36.7 C) (Oral)  Ht 5\' 8"  (1.727 m)  Wt 178 lb 8 oz (80.967 kg)  BMI 27.15 kg/m2  SpO2 98% No data found.   Physical Exam Nursing notes and Vital Signs reviewed. Appearance:  Patient appears stated age, and in no acute distress Eyes:  Pupils are equal, round, and reactive to light and accomodation.  Extraocular movement is intact.  Conjunctivae are not inflamed  Ears:  Canals normal.  Tympanic membranes normal.  Nose:  Congested turbinates.  Maxillary sinus tenderness is present.  Pharynx:  Normal Neck:  Supple.  Tender enlarged posterior nodes are palpated bilaterally  Lungs:  Clear to auscultation.  Breath sounds are equal.  Moving air well. Chest:  Distinct tenderness to palpation over the mid-sternum.  Heart:  Regular rate and rhythm without murmurs, rubs, or gallops.  Abdomen:  Nontender without masses or hepatosplenomegaly.  Bowel sounds are present.  No CVA or flank tenderness.  Extremities:  No edema.  Skin:  No rash present.   ED Course  Procedures none    MDM   1. Acute bronchitis, unspecified organism    Begin Z-pak for atypical coverage.  Rx for Robitussin AC for night time cough.  Take plain guaifenesin (1200mg   extended release tabs such as Mucinex) twice daily, with plenty of water, for cough and congestion.  May add Pseudoephedrine (30mg , one or two every 4 to 6 hours) for sinus congestion.  Get adequate rest.   May use Afrin nasal spray (or generic oxymetazoline) twice daily for about 5 days and then discontinue.  Also recommend using saline nasal spray several times daily and saline nasal irrigation (AYR is a common brand).  Use Flonase nasal spray each morning after using Afrin nasal spray and saline nasal irrigation. Try warm salt water gargles for sore throat.  Stop all antihistamines for now, and other non-prescription cough/cold preparations. May take Ibuprofen 200mg , 4 tabs every 8 hours with food for chest/sternum discomfort.   Follow-up with family doctor if not improving about 7 to 10 days.     Kandra Nicolas, MD 01/24/16 2022

## 2016-01-24 NOTE — Discharge Instructions (Signed)
Take plain guaifenesin (1200mg  extended release tabs such as Mucinex) twice daily, with plenty of water, for cough and congestion.  May add Pseudoephedrine (30mg , one or two every 4 to 6 hours) for sinus congestion.  Get adequate rest.   May use Afrin nasal spray (or generic oxymetazoline) twice daily for about 5 days and then discontinue.  Also recommend using saline nasal spray several times daily and saline nasal irrigation (AYR is a common brand).  Use Flonase nasal spray each morning after using Afrin nasal spray and saline nasal irrigation. Try warm salt water gargles for sore throat.  Stop all antihistamines for now, and other non-prescription cough/cold preparations. May take Ibuprofen 200mg , 4 tabs every 8 hours with food for chest/sternum discomfort.   Follow-up with family doctor if not improving about 7 to 10 days.

## 2016-01-24 NOTE — ED Notes (Signed)
Started with cough a week ago, has become alternating runny /congested nose, red, draining eyes, sneezing, coughing up sputum occasionally, denies fever and body aches.

## 2016-01-26 ENCOUNTER — Telehealth: Payer: Self-pay | Admitting: Emergency Medicine

## 2016-03-08 ENCOUNTER — Encounter: Payer: Self-pay | Admitting: Emergency Medicine

## 2016-03-08 ENCOUNTER — Emergency Department (INDEPENDENT_AMBULATORY_CARE_PROVIDER_SITE_OTHER)
Admission: EM | Admit: 2016-03-08 | Discharge: 2016-03-08 | Disposition: A | Discharge status: Home / Self Care | Payer: 59 | Source: Home / Self Care | Attending: Family Medicine | Admitting: Family Medicine

## 2016-03-08 DIAGNOSIS — H6502 Acute serous otitis media, left ear: Secondary | ICD-10-CM | POA: Diagnosis not present

## 2016-03-08 DIAGNOSIS — J069 Acute upper respiratory infection, unspecified: Secondary | ICD-10-CM | POA: Diagnosis not present

## 2016-03-08 LAB — POCT INFLUENZA A/B
Influenza A, POC: NEGATIVE
Influenza B, POC: NEGATIVE

## 2016-03-08 MED ORDER — GUAIFENESIN 100 MG/5ML PO LIQD: 200.0000 mg | ORAL | Status: DC | PRN

## 2016-03-08 MED ORDER — AMOXICILLIN-POT CLAVULANATE 875-125 MG PO TABS
1.0000 | ORAL_TABLET | Freq: Two times a day (BID) | ORAL | Status: DC
Start: 2016-03-08 — End: 2016-09-08

## 2016-03-08 NOTE — ED Notes (Signed)
Patient presents with cough cold nasal congestion and chills times 3 days. Also C/O a Mouth ulcer and nausea

## 2016-03-08 NOTE — Discharge Instructions (Signed)
You may take 400-600mg Ibuprofen (Motrin) every 6-8 hours for fever and pain  °Alternate with Tylenol  °You may take 500mg Tylenol every 4-6 hours as needed for fever and pain  °Follow-up with your primary care provider next week for recheck of symptoms if not improving.  °Be sure to drink plenty of fluids and rest, at least 8hrs of sleep a night, preferably more while you are sick. °Return urgent care or go to closest ER if you cannot keep down fluids/signs of dehydration, fever not reducing with Tylenol, difficulty breathing/wheezing, stiff neck, worsening condition, or other concerns (see below)  °Please take antibiotics as prescribed and be sure to complete entire course even if you start to feel better to ensure infection does not come back. ° ° °Cool Mist Vaporizers °Vaporizers may help relieve the symptoms of a cough and cold. They add moisture to the air, which helps mucus to become thinner and less sticky. This makes it easier to breathe and cough up secretions. Cool mist vaporizers do not cause serious burns like hot mist vaporizers, which may also be called steamers or humidifiers. Vaporizers have not been proven to help with colds. You should not use a vaporizer if you are allergic to mold. °HOME CARE INSTRUCTIONS °· Follow the package instructions for the vaporizer. °· Do not use anything other than distilled water in the vaporizer. °· Do not run the vaporizer all of the time. This can cause mold or bacteria to grow in the vaporizer. °· Clean the vaporizer after each time it is used. °· Clean and dry the vaporizer well before storing it. °· Stop using the vaporizer if worsening respiratory symptoms develop. °  °This information is not intended to replace advice given to you by your health care provider. Make sure you discuss any questions you have with your health care provider. °  °Document Released: 09/11/2004 Document Revised: 12/20/2013 Document Reviewed: 05/04/2013 °Elsevier Interactive Patient  Education ©2016 [employer] ° °

## 2016-03-08 NOTE — ED Provider Notes (Signed)
CSN: [id number]     Arrival date & time 03/08/16  1746 History   First MD Initiated Contact with Patient 03/08/16 1820     Chief Complaint  Patient presents with  . Cough  . Nasal Congestion  . Chills  . Nausea   (Consider location/radiation/quality/duration/timing/severity/associated sxs/prior Treatment) HPI  The pt is a 50yo female presenting to [facility] with c/o nasal congestion, mild intermittent productive cough with yellow mucous, and chills for 3 days.  She is also c/o a mouth ulcer on her lower lip and nausea.  Several other people around her have been sick.  She has tried OTC medications but no relief.  She did not get the flu vaccine this year.   Past Medical History  Diagnosis Date  . Panic attack   . Anxiety   . Tubular adenoma of colon     11/11/13 with 5 year repeat  . 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 Cholecystectomy    . Cervical polypectomy  09/29/2012  . Ablation      uterine   Family History  Problem Relation Age of Onset  . Brain cancer Mother     Tumor on brainstem- not genetic  . Colon cancer Maternal Aunt   . Pancreatic cancer Neg Hx   . Stomach cancer Neg Hx    Social History  Substance Use Topics  . Smoking status: Never Smoker   . Smokeless tobacco: Never Used  . Alcohol Use: 0.0 oz/week    0 Standard drinks or equivalent per week     Comment: rare wine   OB History    No data available     Review of Systems  Constitutional: Positive for fever, chills and fatigue.  HENT: Positive for congestion, ear pain, rhinorrhea, sinus pressure, sneezing and sore throat. Negative for trouble swallowing and voice change.   Respiratory: Positive for cough. Negative for shortness of breath.   Cardiovascular: Negative for chest pain and palpitations.  Gastrointestinal: Positive for nausea and abdominal pain. Negative for vomiting and  diarrhea.  Musculoskeletal: Positive for myalgias and arthralgias. Negative for back pain.  Skin: Negative for rash.    Allergies  Citalopram hydrobromide and Wellbutrin  Home Medications   Prior to Admission medications   Medication Sig Start Date End Date Taking? Authorizing Provider  amoxicillin-clavulanate (AUGMENTIN) 875-125 MG tablet Take 1 tablet by mouth 2 (two) times daily. One po bid x 7 days 03/08/16   Noland Fordyce, PA-C  azithromycin (ZITHROMAX Z-PAK) 250 MG tablet Take 2 tabs today; then begin one tab once daily for 4 more days. 01/24/16   Kandra Nicolas, MD  guaiFENesin (ROBITUSSIN) 100 MG/5ML liquid Take 10-20 mLs (200-400 mg total) by mouth every 4 (four) hours as needed for cough. 03/08/16   Noland Fordyce, PA-C  guaiFENesin-codeine 100-10 MG/5ML syrup Take 78mL by mouth at bedtime as needed for cough 01/24/16   Kandra Nicolas, MD  LORazepam (ATIVAN) 0.5 MG tablet TAKE ONE TABLET BY MOUTH EVERY 8 HOURS AS NEEDED FOR ANXIETY 01/14/16   Marin Olp, MD  Multiple Vitamin (MULTIVITAMIN) tablet Take 1 tablet by mouth daily.      Historical Provider, MD  SUMAtriptan (IMITREX) 100 MG tablet Take 1 tablet (100 mg total) by mouth every 2 (two) hours as needed. For migraines.usually only takes once a month around menstrual cycle. 11/13/15   Marin Olp,  MD   Meds Ordered and Administered this Visit  Medications - No data to display  BP 124/83 mmHg  Pulse 95  Temp(Src) 98.4 F (36.9 C) (Oral)  Resp 16  Ht 5\' 8"  (1.727 m)  Wt 175 lb 8 oz (79.606 kg)  BMI 26.69 kg/m2  SpO2 97% No data found.   Physical Exam  Constitutional: She appears well-developed and well-nourished. No distress.  HENT:  Head: Normocephalic and atraumatic.  Right Ear: A middle ear effusion is present.  Left Ear: Tympanic membrane is erythematous and bulging. A middle ear effusion is present.  Nose: Mucosal edema present.  Mouth/Throat: Uvula is midline and mucous membranes are normal. Posterior  oropharyngeal erythema present. No oropharyngeal exudate, posterior oropharyngeal edema or tonsillar abscesses.  0.5cm ulceration on lower lip, mucosal aspect c/w canker sore  Eyes: Conjunctivae are normal. No scleral icterus.  Neck: Normal range of motion. Neck supple.  Cardiovascular: Normal rate, regular rhythm and normal heart sounds.   Pulmonary/Chest: Effort normal and breath sounds normal. No respiratory distress. She has no wheezes. She has no rales.  Dry hacking cough, no respiratory distress.  Abdominal: Soft. She exhibits no distension. There is no tenderness.  Musculoskeletal: Normal range of motion.  Neurological: She is alert.  Skin: Skin is warm and dry. She is not diaphoretic.  Nursing note and vitals reviewed.   ED Course  Procedures (including critical care time)  Labs Review Labs Reviewed  POCT INFLUENZA A/B    Imaging Review No results found.    MDM   1. Acute serous otitis media of left ear, recurrence not specified   2. Acute upper respiratory infection    Pt c/o worsening URI symptoms for 3 days.  Other sick contacts.   Rapid flu: negative   Left TM c/w AOM Rx: Augmentin and robitussin   Advised pt to use acetaminophen and ibuprofen as needed for fever and pain. Encouraged rest and fluids. F/u with PCP in 7-10 days if not improving, sooner if worsening. Pt verbalized understanding and agreement with tx plan.     Noland Fordyce, PA-C 03/09/16 1050

## 2016-03-24 ENCOUNTER — Encounter: Payer: Self-pay | Admitting: Family Medicine

## 2016-03-27 ENCOUNTER — Encounter: Payer: Self-pay | Admitting: Family Medicine

## 2016-07-21 ENCOUNTER — Other Ambulatory Visit: Payer: Self-pay | Admitting: Family Medicine

## 2016-07-22 NOTE — Telephone Encounter (Signed)
May give enough to get to an appointment- I had noted 6 month follow up

## 2016-08-22 ENCOUNTER — Ambulatory Visit: Payer: 59 | Admitting: Family Medicine

## 2016-08-28 ENCOUNTER — Ambulatory Visit: Payer: 59 | Admitting: Family Medicine

## 2016-08-28 DIAGNOSIS — Z0289 Encounter for other administrative examinations: Secondary | ICD-10-CM

## 2016-09-08 ENCOUNTER — Encounter: Payer: Self-pay | Admitting: Family Medicine

## 2016-09-08 ENCOUNTER — Ambulatory Visit (INDEPENDENT_AMBULATORY_CARE_PROVIDER_SITE_OTHER): Payer: 59 | Admitting: Family Medicine

## 2016-09-08 DIAGNOSIS — F410 Panic disorder [episodic paroxysmal anxiety] without agoraphobia: Secondary | ICD-10-CM | POA: Diagnosis not present

## 2016-09-08 MED ORDER — LORAZEPAM 0.5 MG PO TABS: 0.5000 mg | ORAL_TABLET | Freq: Three times a day (TID) | ORAL | 3 refills | Status: DC | PRN

## 2016-09-08 MED ORDER — SUMATRIPTAN SUCCINATE 100 MG PO TABS: 100.0000 mg | ORAL_TABLET | ORAL | 5 refills | Status: DC | PRN

## 2016-09-08 NOTE — Progress Notes (Signed)
Pre visit review using our clinic review tool, if applicable. No additional management support is needed unless otherwise documented below in the visit note. 

## 2016-09-08 NOTE — Patient Instructions (Signed)
Refilled ativan  Keep a log and then average out how many pills a month you are taking over next 6 months  See our behavioral health specialists- lets see if with their help your usage can decrease  Consider antidepressant at follow up to further reduce need for ativan

## 2016-09-08 NOTE — Progress Notes (Signed)
Subjective:  Donna Hancock is a 50 y.o. year old very pleasant female patient who presents for/with See problem oriented charting ROS- no SI/HI. No chest pain or shortness of breath. Rare headaches- usually about once a monthsee any ROS included in HPI as well.   Past Medical History-  Patient Active Problem List   Diagnosis Date Noted  . IBS (irritable bowel syndrome) 09/28/2013    Priority: Medium  . Panic attacks 09/07/2007    Priority: Medium  . Migraine headache 10/19/2006    Priority: Medium  . Tubular adenoma of colon     Priority: Low  . GERD (gastroesophageal reflux disease) 09/28/2013    Priority: Low  . Allergic rhinitis 09/07/2007    Priority: Low    Medications- reviewed and updated Current Outpatient Prescriptions  Medication Sig Dispense Refill  . LORazepam (ATIVAN) 0.5 MG tablet Take 1 tablet (0.5 mg total) by mouth every 8 (eight) hours as needed. for anxiety 60 tablet 3  . Multiple Vitamin (MULTIVITAMIN) tablet Take 1 tablet by mouth daily.      . SUMAtriptan (IMITREX) 100 MG tablet Take 1 tablet (100 mg total) by mouth every 2 (two) hours as needed. Twice a day maximum 10 tablet 5   No current facility-administered medications for this visit.     Objective: BP 128/88 (BP Location: Left Arm, Patient Position: Sitting, Cuff Size: Normal)   Pulse 80   Temp 98 F (36.7 C) (Oral)   Wt 182 lb 3.2 oz (82.6 kg)   SpO2 95%   BMI 27.70 kg/m  Gen: NAD, resting comfortably CV: RRR no murmurs rubs or gallops Lungs: CTAB no crackles, wheeze, rhonchi Abdomen: soft/nontender/nondistended/normal bowel sounds.  Ext: no edema Skin: warm, dry Neuro: grossly normal, moves all extremities  Assessment/Plan:  Panic attacks S: Multiple SSRI failures in past due to intolerance- primarily states "her head feels funny" including zoloft, citalopram, lexapro. Wellbutrin increased anxiety so much she went to hospital. She takes ativan most nights and sometimes takes in the day as  well for anxiety.  A/P: we discussed would really like to improve underlying anxiety to reduce need for benzodiazepines. She does not think she would tolerate SSRI but I would like to try one more time perhaps prozac. For now, encouraged her to see behavioral health and to track usage of ativan and follow up before she runs out though advised the #240 to last at least 6 months- 180 would cover her for all nights but hopeful may use even less than that. Perhaps if we can control anxiety better at baseline we can get her on an ssri at follow up- suspect difficult path for this transition though.    Return in about 6 months (around 03/08/2017).  Meds ordered this encounter  Medications  . SUMAtriptan (IMITREX) 100 MG tablet    Sig: Take 1 tablet (100 mg total) by mouth every 2 (two) hours as needed. Twice a day maximum    Dispense:  10 tablet    Refill:  5  . LORazepam (ATIVAN) 0.5 MG tablet    Sig: Take 1 tablet (0.5 mg total) by mouth every 8 (eight) hours as needed. for anxiety    Dispense:  60 tablet    Refill:  3   The duration of face-to-face time during this visit was greater than 20 minutes. Greater than 50% of this time was spent in counseling,about panic attacks, desire for SSRI over benzos and risks of benzos.     Return precautions advised.  Garret Reddish, MD

## 2016-09-08 NOTE — Assessment & Plan Note (Signed)
S: Multiple SSRI failures in past due to intolerance- primarily states "her head feels funny" including zoloft, citalopram, lexapro. Wellbutrin increased anxiety so much she went to hospital. She takes ativan most nights and sometimes takes in the day as well for anxiety.  A/P: we discussed would really like to improve underlying anxiety to reduce need for benzodiazepines. She does not think she would tolerate SSRI but I would like to try one more time perhaps prozac. For now, encouraged her to see behavioral health and to track usage of ativan and follow up before she runs out though advised the #240 to last at least 6 months- 180 would cover her for all nights but hopeful may use even less than that. Perhaps if we can control anxiety better at baseline we can get her on an ssri at follow up- suspect difficult path for this transition though.

## 2017-01-17 ENCOUNTER — Encounter: Payer: Self-pay | Admitting: Emergency Medicine

## 2017-01-17 ENCOUNTER — Emergency Department (INDEPENDENT_AMBULATORY_CARE_PROVIDER_SITE_OTHER)
Admission: EM | Admit: 2017-01-17 | Discharge: 2017-01-17 | Disposition: A | Discharge status: Home / Self Care | Payer: No Typology Code available for payment source | Source: Home / Self Care | Attending: Family Medicine | Admitting: Family Medicine

## 2017-01-17 DIAGNOSIS — B9789 Other viral agents as the cause of diseases classified elsewhere: Secondary | ICD-10-CM | POA: Diagnosis not present

## 2017-01-17 DIAGNOSIS — K120 Recurrent oral aphthae: Secondary | ICD-10-CM

## 2017-01-17 DIAGNOSIS — J069 Acute upper respiratory infection, unspecified: Secondary | ICD-10-CM | POA: Diagnosis not present

## 2017-01-17 MED ORDER — TRIAMCINOLONE ACETONIDE 0.1 % MT PSTE: PASTE | OROMUCOSAL | 0 refills | Status: DC

## 2017-01-17 MED ORDER — BENZONATATE 200 MG PO CAPS: ORAL_CAPSULE | ORAL | 0 refills | Status: DC

## 2017-01-17 MED ORDER — AZITHROMYCIN 250 MG PO TABS: ORAL_TABLET | ORAL | 0 refills | Status: DC

## 2017-01-17 NOTE — ED Triage Notes (Signed)
Patient reports 3 days of mild cough, fatigue, general aches, and swollen glands of neck with some throat rawness. No OTC today.

## 2017-01-17 NOTE — ED Provider Notes (Signed)
[facility]    CSN: [id number] Arrival date & time: 01/17/17  1121     History   Chief Complaint Chief Complaint  Patient presents with  . Generalized Body Aches  . Fatigue  . Cough    HPI Donna Hancock is a 51 y.o. female.   Patient complains of three day history of typical cold-like symptoms developing over several days, including mild sore throat, sinus congestion, headache, myalgias, fatigue, and cough.  She has developed a sore ulcer inside her right cheek.  She reports that she has a history of recurring bronchitis.  She was recently diagnosed with cutaneous lupus.   The history is provided by the patient.    Past Medical History:  Diagnosis Date  . ANEMIA-IRON DEFICIENCY 12/21/2008   Likely related to menstruation- now resolved after ablation    . Anxiety   . Panic attack   . Tubular adenoma of colon    11/11/13 with 5 year repeat    Patient Active Problem List   Diagnosis Date Noted  . Tubular adenoma of colon   . IBS (irritable bowel syndrome) 09/28/2013  . GERD (gastroesophageal reflux disease) 09/28/2013  . Panic attacks 09/07/2007  . Allergic rhinitis 09/07/2007  . Migraine headache 10/19/2006    Past Surgical History:  Procedure Laterality Date  . ABLATION     uterine  . CERVICAL POLYPECTOMY  09/29/2012  . CHOLECYSTECTOMY      OB History    No data available       Home Medications    Prior to Admission medications   Medication Sig Start Date End Date Taking? Authorizing Provider  azithromycin (ZITHROMAX Z-PAK) 250 MG tablet Take 2 tabs today; then begin one tab once daily for 4 more days. 01/17/17   Kandra Nicolas, MD  benzonatate (TESSALON) 200 MG capsule Take one cap by mouth at bedtime as needed for cough.  May repeat in 4 to 6 hours 01/17/17   Kandra Nicolas, MD  LORazepam (ATIVAN) 0.5 MG tablet Take 1 tablet (0.5 mg total) by mouth every 8 (eight) hours as needed. for anxiety 09/08/16   Marin Olp, MD  Multiple Vitamin  (MULTIVITAMIN) tablet Take 1 tablet by mouth daily.      Historical Provider, MD  SUMAtriptan (IMITREX) 100 MG tablet Take 1 tablet (100 mg total) by mouth every 2 (two) hours as needed. Twice a day maximum 09/08/16   Marin Olp, MD  triamcinolone (KENALOG) 0.1 % paste Place thin layer on mouth ulcers four times daily 01/17/17   Kandra Nicolas, MD    Family History Family History  Problem Relation Age of Onset  . Brain cancer Mother     Tumor on brainstem- not genetic  . Colon cancer Maternal Aunt   . Pancreatic cancer Neg Hx   . Stomach cancer Neg Hx     Social History Social History  Substance Use Topics  . Smoking status: Never Smoker  . Smokeless tobacco: Never Used  . Alcohol use 0.0 oz/week     Comment: rare wine     Allergies   Citalopram hydrobromide; Lexapro [escitalopram oxalate]; and Wellbutrin [bupropion hcl]   Review of Systems Review of Systems + sore throat + mouth ulcer + cough No pleuritic pain No wheezing + nasal congestion + post-nasal drainage No sinus pain/pressure No itchy/red eyes No earache No hemoptysis No SOB No fever, + chills No nausea No vomiting No abdominal pain No diarrhea No urinary symptoms No skin  rash + fatigue + myalgias + headache Used OTC meds without relief   Physical Exam Triage Vital Signs ED Triage Vitals  Enc Vitals Group     BP 01/17/17 1211 113/73     Pulse Rate 01/17/17 1211 76     Resp 01/17/17 1211 16     Temp 01/17/17 1211 98.3 F (36.8 C)     Temp Source 01/17/17 1211 Oral     SpO2 01/17/17 1211 98 %     Weight 01/17/17 1212 179 lb (81.2 kg)     Height 01/17/17 1212 5\' 8"  (1.727 m)     Head Circumference --      Peak Flow --      Pain Score 01/17/17 1214 0     Pain Loc --      Pain Edu? --      Excl. in [place]? --    No data found.   Updated Vital Signs BP 113/73 (BP Location: Left Arm)   Pulse 76   Temp 98.3 F (36.8 C) (Oral)   Resp 16   Ht 5\' 8"  (1.727 m)   Wt 179 lb (81.2 kg)    SpO2 98%   BMI 27.22 kg/m   Visual Acuity Right Eye Distance:   Left Eye Distance:   Bilateral Distance:    Right Eye Near:   Left Eye Near:    Bilateral Near:     Physical Exam Nursing notes and Vital Signs reviewed. Appearance:  Patient appears stated age, and in no acute distress Eyes:  Pupils are equal, round, and reactive to light and accomodation.  Extraocular movement is intact.  Conjunctivae are not inflamed  Ears:  Canals normal.  Tympanic membranes normal.  Nose:  Mildly congested turbinates.  No sinus tenderness.   Pharynx:  Normal Mouth:  Shallow ulcer 53mm diameter right buccal mucosae suggestive of aphthous ulcer. Neck:  Supple.  Tender enlarged posterior/lateral nodes are palpated bilaterally  Lungs:  Clear to auscultation.  Breath sounds are equal.  Moving air well. Heart:  Regular rate and rhythm without murmurs, rubs, or gallops.  Abdomen:  Nontender without masses or hepatosplenomegaly.  Bowel sounds are present.  No CVA or flank tenderness.  Extremities:  No edema.  Skin:  No rash present.    UC Treatments / Results  Labs (all labs ordered are listed, but only abnormal results are displayed) Labs Reviewed - No data to display  EKG  EKG Interpretation None       Radiology No results found.  Procedures Procedures (including critical care time)  Medications Ordered in UC Medications - No data to display   Initial Impression / Assessment and Plan / UC Course  I have reviewed the triage vital signs and the nursing notes.  Pertinent labs & imaging results that were available during my care of the patient were reviewed by me and considered in my medical decision making (see chart for details).    Begin Z-pak for atypical coverage.  Prescription written for Benzonatate [facility]) to take at bedtime for night-time cough.  Rx for Kenalog in [place] for mouth ulcers. Take plain guaifenesin (1200mg  extended release tabs such as Mucinex) twice daily,  with plenty of water, for cough and congestion.  May add Pseudoephedrine (30mg , one or two every 4 to 6 hours) for sinus congestion.  Get adequate rest.   Try warm salt water gargles for sore throat.  Stop all antihistamines for now, and other non-prescription cough/cold preparations. Followup with Family Doctor if  not improved in about 6 days.    Final Clinical Impressions(s) / UC Diagnoses   Final diagnoses:  Viral URI with cough  Aphthous ulcer    New Prescriptions New Prescriptions   AZITHROMYCIN (ZITHROMAX Z-PAK) 250 MG TABLET    Take 2 tabs today; then begin one tab once daily for 4 more days.   BENZONATATE (TESSALON) 200 MG CAPSULE    Take one cap by mouth at bedtime as needed for cough.  May repeat in 4 to 6 hours   TRIAMCINOLONE (KENALOG) 0.1 % PASTE    Place thin layer on mouth ulcers four times daily     Kandra Nicolas, MD 01/24/17 1935

## 2017-01-17 NOTE — Discharge Instructions (Signed)
Take plain guaifenesin (1200mg extended release tabs such as Mucinex) twice daily, with plenty of water, for cough and congestion.  May add Pseudoephedrine (30mg, one or two every 4 to 6 hours) for sinus congestion.  Get adequate rest.   °Try warm salt water gargles for sore throat.  °Stop all antihistamines for now, and other non-prescription cough/cold preparations. °  °

## 2017-03-11 ENCOUNTER — Other Ambulatory Visit: Payer: Self-pay | Admitting: Family Medicine

## 2017-03-12 ENCOUNTER — Other Ambulatory Visit: Payer: Self-pay | Admitting: Family Medicine

## 2017-07-07 ENCOUNTER — Ambulatory Visit: Payer: PRIVATE HEALTH INSURANCE | Admitting: Family Medicine

## 2017-07-15 ENCOUNTER — Ambulatory Visit (INDEPENDENT_AMBULATORY_CARE_PROVIDER_SITE_OTHER): Payer: No Typology Code available for payment source | Admitting: Family Medicine

## 2017-07-15 ENCOUNTER — Encounter: Payer: Self-pay | Admitting: Family Medicine

## 2017-07-15 VITALS — BP 116/80 | HR 87 | Temp 97.8°F | Ht 68.0 in | Wt 177.6 lb

## 2017-07-15 DIAGNOSIS — R109 Unspecified abdominal pain: Secondary | ICD-10-CM | POA: Diagnosis not present

## 2017-07-15 DIAGNOSIS — F410 Panic disorder [episodic paroxysmal anxiety] without agoraphobia: Secondary | ICD-10-CM | POA: Diagnosis not present

## 2017-07-15 MED ORDER — LORAZEPAM 0.5 MG PO TABS: 0.5000 mg | ORAL_TABLET | Freq: Three times a day (TID) | ORAL | 1 refills | Status: DC | PRN

## 2017-07-15 NOTE — Patient Instructions (Signed)
Glad abdominal pain resolved  please let us know and see [place] if returns  Would target using ativan 1-2x a week maximum

## 2017-07-15 NOTE — Progress Notes (Signed)
Subjective:  Donna Hancock is a 51 y.o. year old very pleasant female patient who presents for/with See problem oriented charting ROS- declines nausea, vomiting, chest pain or shortness of breath. Admits to anxiety. Slight cough just started today  Past Medical History-  Patient Active Problem List   Diagnosis Date Noted  . IBS (irritable bowel syndrome) 09/28/2013    Priority: Medium  . Panic attacks 09/07/2007    Priority: Medium  . Migraine headache 10/19/2006    Priority: Medium  . Tubular adenoma of colon     Priority: Low  . GERD (gastroesophageal reflux disease) 09/28/2013    Priority: Low  . Allergic rhinitis 09/07/2007    Priority: Low    Medications- reviewed and updated Current Outpatient Prescriptions  Medication Sig Dispense Refill  . LORazepam (ATIVAN) 0.5 MG tablet Take 1 tablet (0.5 mg total) by mouth every 8 (eight) hours as needed. for anxiety 60 tablet 1  . SUMAtriptan (IMITREX) 100 MG tablet Take 1 tablet (100 mg total) by mouth every 2 (two) hours as needed. Twice a day maximum 10 tablet 5   No current facility-administered medications for this visit.     Objective: BP 116/80 (BP Location: Left Arm, Patient Position: Sitting, Cuff Size: Large)   Pulse 87   Temp 97.8 F (36.6 C) (Oral)   Ht 5\' 8"  (1.727 m)   Wt 177 lb 9.6 oz (80.6 kg)   SpO2 96%   BMI 27.00 kg/m  Gen: NAD, resting comfortably CV: RRR no murmurs rubs or gallops Lungs: CTAB no crackles, wheeze, rhonchi Abdomen: soft/nontender/nondistended/normal bowel sounds. No rebound or guarding.  No rib pain today Ext: no edema Skin: warm, dry  Assessment/Plan:  Right sided abdominal pain S: right sided rib pain for a week in late May. Gallbladder removed in 2008. Went away within about a week. If pushes hard on ribs may have some mild tenderness. No chest pain, shortness of breath, nausea, vomiting.   She states it was around the time of ED trip and records show 05/24/17 accidental ingestion of  marijuana brownie and she felt dizzy, dry in her mouth and nauseous. Seems pain started that evening and lasted for a week A/P: has had complete resolution of pain at this point. I provided reassurance on this- she admits her anxiety contributes to her concern and she wanted to keep me in the loop despite resolution.   Panic attacks S: last visit about 10 months ago patient was using ativan nightly as well as during day for anxiety. Fortunately her anxiety has been well controlled despite [place] not adding SSRI (she has had several failures in the past). Has been able to sleep without medication at times. Melatonin had helped in past for sleep as well.   Reviewed [facility] for ativan and has used #60 lasted since February which is well under 1 pill a day A/P: Thrilled she is using ativan less frequently. We discussed 1-2x a week maximum. I encouraged physical within 6 months to check in on other health issues/baseline labs.    Advised CPE within 6 months  Meds ordered this encounter  Medications  . LORazepam (ATIVAN) 0.5 MG tablet    Sig: Take 1 tablet (0.5 mg total) by mouth every 8 (eight) hours as needed. for anxiety    Dispense:  60 tablet    Refill:  1    Return precautions advised.  Garret Reddish, MD

## 2017-07-15 NOTE — Assessment & Plan Note (Signed)
S: last visit about 10 months ago patient was using ativan nightly as well as during day for anxiety. Fortunately her anxiety has been well controlled despite [place] not adding SSRI (she has had several failures in the past). Has been able to sleep without medication at times. Melatonin had helped in past for sleep as well.   Reviewed [facility] for ativan and has used #60 lasted since February which is well under 1 pill a day A/P: Thrilled she is using ativan less frequently. We discussed 1-2x a week maximum. I encouraged physical within 6 months to check in on other health issues/baseline labs.

## 2017-08-17 ENCOUNTER — Other Ambulatory Visit: Payer: Self-pay | Admitting: Obstetrics and Gynecology

## 2017-08-17 DIAGNOSIS — Z1231 Encounter for screening mammogram for malignant neoplasm of breast: Secondary | ICD-10-CM

## 2017-08-28 ENCOUNTER — Encounter: Payer: Self-pay | Admitting: Family Medicine

## 2017-09-02 ENCOUNTER — Emergency Department
Admission: EM | Admit: 2017-09-02 | Discharge: 2017-09-02 | Disposition: A | Discharge status: Home / Self Care | Payer: PRIVATE HEALTH INSURANCE | Source: Home / Self Care | Attending: Family Medicine | Admitting: Family Medicine

## 2017-09-02 ENCOUNTER — Encounter: Payer: Self-pay | Admitting: Family Medicine

## 2017-09-02 ENCOUNTER — Encounter: Payer: Self-pay | Admitting: *Deleted

## 2017-09-02 DIAGNOSIS — B9789 Other viral agents as the cause of diseases classified elsewhere: Secondary | ICD-10-CM

## 2017-09-02 DIAGNOSIS — J069 Acute upper respiratory infection, unspecified: Secondary | ICD-10-CM

## 2017-09-02 MED ORDER — AZITHROMYCIN 250 MG PO TABS: ORAL_TABLET | ORAL | 0 refills | Status: DC

## 2017-09-02 MED ORDER — PREDNISONE 20 MG PO TABS: ORAL_TABLET | ORAL | 0 refills | Status: DC

## 2017-09-02 MED ORDER — GUAIFENESIN-CODEINE 100-10 MG/5ML PO SOLN: ORAL | 0 refills | Status: DC

## 2017-09-02 NOTE — ED Triage Notes (Signed)
Pt c/o productive cough x 1 wk. Denies fever. Took Mucinex last week.

## 2017-09-02 NOTE — Discharge Instructions (Signed)
Take plain guaifenesin (1200mg  extended release tabs such as Mucinex) twice daily, with plenty of water, for cough and congestion.  May add Pseudoephedrine (30mg , one or two every 4 to 6 hours) for sinus congestion.  Get adequate rest.    May take Delsym Cough Suppressant at bedtime for nighttime cough.  Try warm salt water gargles for sore throat.  Stop all antihistamines for now, and other non-prescription cough/cold preparations.   Follow-up with family doctor if not improving about10 days.

## 2017-09-02 NOTE — ED Provider Notes (Signed)
[name]    CSN: [id number] Arrival date & time: 09/02/17  1825     History   Chief Complaint Chief Complaint  Patient presents with  . Cough    HPI Donna Hancock is a 51 y.o. female.   About 9 days ago patient developed chest congestion and productive cough with minimal other symptoms.  Her cough is worse at night.  She denies pleuritic pain and shortness of breath. She has a history of sinusitis and bronchitis.   The history is provided by the patient.    Past Medical History:  Diagnosis Date  . ANEMIA-IRON DEFICIENCY 12/21/2008   Likely related to menstruation- now resolved after ablation    . Anxiety   . Panic attack   . Tubular adenoma of colon    11/11/13 with 5 year repeat    Patient Active Problem List   Diagnosis Date Noted  . Tubular adenoma of colon   . IBS (irritable bowel syndrome) 09/28/2013  . GERD (gastroesophageal reflux disease) 09/28/2013  . Panic attacks 09/07/2007  . Allergic rhinitis 09/07/2007  . Migraine headache 10/19/2006    Past Surgical History:  Procedure Laterality Date  . ABLATION     uterine  . CERVICAL POLYPECTOMY  09/29/2012  . CHOLECYSTECTOMY      OB History    No data available       Home Medications    Prior to Admission medications   Medication Sig Start Date End Date Taking? Authorizing Provider  LORazepam (ATIVAN) 0.5 MG tablet Take 1 tablet (0.5 mg total) by mouth every 8 (eight) hours as needed. for anxiety 07/15/17  Yes Marin Olp, MD  azithromycin (ZITHROMAX Z-PAK) 250 MG tablet Take 2 tabs today; then begin one tab once daily for 4 more days. 09/02/17   Kandra Nicolas, MD  guaiFENesin-codeine 100-10 MG/5ML syrup Take 10mL by mouth at bedtime as needed for cough 09/02/17   Kandra Nicolas, MD  predniSONE (DELTASONE) 20 MG tablet Take one tab by mouth twice daily for 4 days, then one daily. Take with food. 09/02/17   Kandra Nicolas, MD  SUMAtriptan (IMITREX) 100 MG tablet Take 1 tablet (100 mg  total) by mouth every 2 (two) hours as needed. Twice a day maximum 09/08/16   Marin Olp, MD    Family History Family History  Problem Relation Age of Onset  . Brain cancer Mother        Tumor on brainstem- not genetic  . Colon cancer Maternal Aunt   . Pancreatic cancer Neg Hx   . Stomach cancer Neg Hx     Social History Social History  Substance Use Topics  . Smoking status: Never Smoker  . Smokeless tobacco: Never Used  . Alcohol use 0.0 oz/week     Comment: rare wine     Allergies   Citalopram hydrobromide; Lexapro [escitalopram oxalate]; and Wellbutrin [bupropion hcl]   Review of Systems Review of Systems  No sore throat + cough No pleuritic pain No wheezing ? nasal congestion No post-nasal drainage No sinus pain/pressure No itchy/red eyes No earache No hemoptysis No SOB No fever/chills No nausea No vomiting No abdominal pain No diarrhea No urinary symptoms No skin rash + fatigue No myalgias No headache Used OTC meds without relief    Physical Exam Triage Vital Signs ED Triage Vitals [09/02/17 1842]  Enc Vitals Group     BP (!) 145/87     Pulse Rate 75  Resp 18     Temp 98.3 F (36.8 C)     Temp Source Oral     SpO2 97 %     Weight 179 lb (81.2 kg)     Height 5\' 8"  (1.727 m)     Head Circumference      Peak Flow      Pain Score 0     Pain Loc      Pain Edu?      Excl. in [place]?    No data found.   Updated Vital Signs BP (!) 145/87 (BP Location: Left Arm)   Pulse 75   Temp 98.3 F (36.8 C) (Oral)   Resp 18   Ht 5\' 8"  (1.727 m)   Wt 179 lb (81.2 kg)   SpO2 97%   BMI 27.22 kg/m   Visual Acuity Right Eye Distance:   Left Eye Distance:   Bilateral Distance:    Right Eye Near:   Left Eye Near:    Bilateral Near:     Physical Exam Nursing notes and Vital Signs reviewed. Appearance:  Patient appears stated age, and in no acute distress Eyes:  Pupils are equal, round, and reactive to light and accomodation.   Extraocular movement is intact.  Conjunctivae are not inflamed  Ears:  Canals normal.  Tympanic membranes normal.  Nose:  Mildly congested turbinates.  No sinus tenderness.    Pharynx:  Normal Neck:  Supple.  Enlarged posterior/lateral nodes are palpated bilaterally, tender to palpation on the left.   Lungs:  Clear to auscultation.  Breath sounds are equal.  Moving air well. Chest:  Distinct tenderness to palpation over the mid-sternum.  Heart:  Regular rate and rhythm without murmurs, rubs, or gallops.  Abdomen:  Nontender without masses or hepatosplenomegaly.  Bowel sounds are present.  No CVA or flank tenderness.  Extremities:  No edema.  Skin:  No rash present.    UC Treatments / Results  Labs (all labs ordered are listed, but only abnormal results are displayed) Labs Reviewed - No data to display  EKG  EKG Interpretation None       Radiology No results found.  Procedures Procedures (including critical care time)  Medications Ordered in UC Medications - No data to display   Initial Impression / Assessment and Plan / UC Course  I have reviewed the triage vital signs and the nursing notes.  Pertinent labs & imaging results that were available during my care of the patient were reviewed by me and considered in my medical decision making (see chart for details).    Begin Z-pak for atypical coverage, and prednisone burst/taper. Take plain guaifenesin (1200mg  extended release tabs such as Mucinex) twice daily, with plenty of water, for cough and congestion.  May add Pseudoephedrine (30mg , one or two every 4 to 6 hours) for sinus congestion.  Get adequate rest.    May take Delsym Cough Suppressant at bedtime for nighttime cough (Given Rx for Robitussin AC if Delsym not effective at night). Controlled Substance Prescriptions I have consulted the [place] Controlled Substances Registry for this patient, and feel the risk/benefit ratio today is favorable for proceeding with this  prescription for a controlled substance.  Try warm salt water gargles for sore throat.  Stop all antihistamines for now, and other non-prescription cough/cold preparations.   Follow-up with family doctor if not improving about10 days.     Final Clinical Impressions(s) / UC Diagnoses   Final diagnoses:  Viral URI with cough  New Prescriptions New Prescriptions   AZITHROMYCIN (ZITHROMAX Z-PAK) 250 MG TABLET    Take 2 tabs today; then begin one tab once daily for 4 more days.   GUAIFENESIN-CODEINE 100-10 MG/5ML SYRUP    Take 26mL by mouth at bedtime as needed for cough   PREDNISONE (DELTASONE) 20 MG TABLET    Take one tab by mouth twice daily for 4 days, then one daily. Take with food.         Kandra Nicolas, MD 09/03/17 [phone]

## 2017-09-02 NOTE — Telephone Encounter (Signed)
Spoke with pt and advised of reply to other MyChart message. Dr. Yong Channel has stated he cannot e-scribe any cough medications. Advised pt she can try Delsym and if not improving over several days or if symptoms worsen she can call office to schedule an appt. Pt voiced understanding. Nothing further needed at this time.

## 2017-09-08 ENCOUNTER — Ambulatory Visit (HOSPITAL_COMMUNITY): Payer: No Typology Code available for payment source

## 2017-12-31 ENCOUNTER — Encounter: Payer: Self-pay | Admitting: Family Medicine

## 2018-01-01 ENCOUNTER — Encounter: Payer: Self-pay | Admitting: Family Medicine

## 2018-01-01 ENCOUNTER — Other Ambulatory Visit: Payer: Self-pay

## 2018-01-01 ENCOUNTER — Ambulatory Visit: Payer: No Typology Code available for payment source | Admitting: Family Medicine

## 2018-01-01 MED ORDER — LORAZEPAM 0.5 MG PO TABS: 0.5000 mg | ORAL_TABLET | Freq: Three times a day (TID) | ORAL | 0 refills | Status: DC | PRN

## 2018-01-01 MED ORDER — SUMATRIPTAN SUCCINATE 100 MG PO TABS: 100.0000 mg | ORAL_TABLET | ORAL | 0 refills | Status: DC | PRN

## 2018-02-11 ENCOUNTER — Telehealth: Payer: Self-pay | Admitting: Family Medicine

## 2018-02-11 NOTE — Telephone Encounter (Signed)
Is this possible? As far as I understand, we don't accept Medicaid period and the patient would be considered self pay.    Copied from [place]. Topic: Appointment Scheduling - Scheduling Inquiry for Clinic >> Feb 05, 2018 11:12 AM Arletha Grippe wrote: Reason for CRM: pt called stating the Dr Yong Channel told her that she could get a cpe and pap.  She has Medicaid family planning.  I dont have in the chart or crm's where Dr Yong Channel agreed to this. Please investigate this further and call pt back with update about the appt she is requesting.  Cb is [phone]

## 2018-02-12 NOTE — Telephone Encounter (Signed)
I am happy to complete one- this is a question for Lea if its covered. I just dont want her to get charged full amount

## 2018-02-22 NOTE — Telephone Encounter (Signed)
This looks like it should be covered if we follow the guidelines in the forms that I gave you.

## 2018-02-26 ENCOUNTER — Other Ambulatory Visit: Payer: Self-pay

## 2018-02-26 MED ORDER — LORAZEPAM 0.5 MG PO TABS: 0.5000 mg | ORAL_TABLET | Freq: Three times a day (TID) | ORAL | 0 refills | Status: DC | PRN

## 2018-03-03 NOTE — Telephone Encounter (Signed)
Patient scheduled.

## 2018-03-23 ENCOUNTER — Other Ambulatory Visit (HOSPITAL_COMMUNITY): Payer: Self-pay | Admitting: *Deleted

## 2018-03-23 DIAGNOSIS — M79621 Pain in right upper arm: Secondary | ICD-10-CM

## 2018-04-01 ENCOUNTER — Ambulatory Visit (HOSPITAL_COMMUNITY): Payer: No Typology Code available for payment source

## 2018-04-15 ENCOUNTER — Other Ambulatory Visit: Payer: No Typology Code available for payment source

## 2018-04-15 ENCOUNTER — Ambulatory Visit (HOSPITAL_COMMUNITY): Payer: No Typology Code available for payment source

## 2018-05-10 ENCOUNTER — Ambulatory Visit: Payer: 59 | Admitting: Family Medicine

## 2018-05-10 ENCOUNTER — Encounter: Payer: Self-pay | Admitting: Family Medicine

## 2018-05-10 VITALS — BP 112/82 | HR 80 | Temp 98.4°F | Ht 68.0 in | Wt 181.4 lb

## 2018-05-10 DIAGNOSIS — L932 Other local lupus erythematosus: Secondary | ICD-10-CM | POA: Diagnosis not present

## 2018-05-10 DIAGNOSIS — F410 Panic disorder [episodic paroxysmal anxiety] without agoraphobia: Secondary | ICD-10-CM

## 2018-05-10 DIAGNOSIS — R5383 Other fatigue: Secondary | ICD-10-CM

## 2018-05-10 DIAGNOSIS — R1013 Epigastric pain: Secondary | ICD-10-CM

## 2018-05-10 DIAGNOSIS — K219 Gastro-esophageal reflux disease without esophagitis: Secondary | ICD-10-CM | POA: Diagnosis not present

## 2018-05-10 MED ORDER — SERTRALINE HCL 25 MG PO TABS: 25.0000 mg | ORAL_TABLET | Freq: Every day | ORAL | 5 refills | Status: DC

## 2018-05-10 MED ORDER — OMEPRAZOLE 20 MG PO CPDR: 20.0000 mg | DELAYED_RELEASE_CAPSULE | Freq: Every day | ORAL | 3 refills | Status: DC

## 2018-05-10 NOTE — Assessment & Plan Note (Addendum)
Patient appears to have GAD as well as panic attacks. Multiple  failures in the past including citalopram and Lexapro (feels funny) along with wellbutrin (seen in ER due to severity of anxiety).  Her GAD 7 score is elevated today at 12.  we will trial low-dose Zoloft at 25 mg with follow-up in 9 days.  If we are unable to titrate down her Ativan use from daily with use of SSRI-may need to refer to psychiatry.  Could also consider medication such as buspirone although there have been some  Shortages - did strongly advise CBT (she seems somewhat resistant- had a bad fit in past)

## 2018-05-10 NOTE — Assessment & Plan Note (Signed)
Reports diagnosed by dermatology specialist in [place].  She ended up in collections when they mailed a bill to her prior address.  Even after she paid that they will not accept her back as a patient.  We will need to get records.  She seems to will be referred to another local dermatologist.  She was apparently on some sort of topical treatment-She wants to restart

## 2018-05-10 NOTE — Progress Notes (Signed)
Subjective:  Donna Hancock is a 52 y.o. year old very pleasant female patient who presents for/with See problem oriented charting ROS-reports fatigue.  Reports increased anxiety.  No reported chest pain or shortness of breath  Past Medical History-  Patient Active Problem List   Diagnosis Date Noted  . 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 IBS (irritable bowel syndrome) 09/28/2013    Priority: Medium  . Panic attacks 09/07/2007    Priority: Medium  . Migraine headache 10/19/2006    Priority: Medium  . Tubular adenoma of colon     Priority: Low  . GERD (gastroesophageal reflux disease) 09/28/2013    Priority: Low  . Allergic rhinitis 09/07/2007    Priority: Low    Medications- reviewed and updated Current Outpatient Medications  Medication Sig Dispense Refill  . LORazepam (ATIVAN) 0.5 MG tablet Take 1 tablet (0.5 mg total) by mouth every 8 (eight) hours as needed. for anxiety 60 tablet 0  . omeprazole (PRILOSEC) 20 MG capsule Take 1 capsule (20 mg total) by mouth daily. 30 capsule 3  . sertraline (ZOLOFT) 25 MG tablet Take 1 tablet (25 mg total) by mouth daily. 30 tablet 5  . SUMAtriptan (IMITREX) 100 MG tablet Take 1 tablet (100 mg total) by mouth every 2 (two) hours as needed. Twice a day maximum 30 tablet 0   No current facility-administered medications for this visit.     Objective: BP 112/82 (BP Location: Left Arm, Patient Position: Sitting, Cuff Size: Normal)   Pulse 80   Temp 98.4 F (36.9 C) (Oral)   Ht 5\' 8"  (1.727 m)   Wt 181 lb 6.4 oz (82.3 kg)   SpO2 96%   BMI 27.58 kg/m  Gen: NAD, resting comfortably CV: RRR no murmurs rubs or gallops Lungs: CTAB no crackles, wheeze, rhonchi Abdomen: soft/moderate pain in epigastric area/nondistended/normal bowel sounds. No rebound or guarding.  Ext: no edema Skin: warm, dry, some mild erythema on dorsal foreamrs  Assessment/Plan:  Fatigue, unspecified type - Plan: CBC with Differential/Platelet,  Comprehensive metabolic panel, TSH  Epigastric abdominal pain - Plan: CBC with Differential/Platelet, Comprehensive metabolic panel, Lipase  Panic attacks  Gastroesophageal reflux disease without esophagitis  Cutaneous lupus erythematosus S:  2-3 days of pretty good fatigue. Has had some sharp shooting pains in right ribs but that has resolved. Has epigastric pain with meals at times- heart burn has been worse lately- took some pepto bismol today. Used to be on omeprazole and it really helped. Had been a while since she needed this- asks about restarting. Has had higher stress from not having her anxiety.   GAD7 of 12 today. Using ativan once a day lately- best friend lost 2 of sons to overdose, stress about not having insurance, new job, breaking up with boyfriend, financial problems as commission only.   Has had some pain under right armpit- getting mammogram and ultrasound with breast center.  A/P:  Fatigue- update labs today with cbc, cmp, tsh. With her epigastric abdominal pain- could be GERD but also check lipase. Hold off on imaging for now- could consider RUQ u/s.   Panic attacks Patient appears to have GAD as well as panic attacks. Multiple  failures in the past including citalopram and Lexapro (feels funny) along with wellbutrin (seen in ER due to severity of anxiety).  Her GAD 7 score is elevated today at 12.  we will trial low-dose Zoloft at 25 mg with follow-up in 9 days.  If we are  unable to titrate down her Ativan use from daily with use of SSRI-may need to refer to psychiatry.  Could also consider medication such as buspirone although there have been some  Shortages - did strongly advise CBT (she seems somewhat resistant- had a bad fit in past)  GERD (gastroesophageal reflux disease) Has been controlled without prescription in the past.  Her weight and anxiety have trended up though.  She would like to restart omeprazole 20 mg-this was sent in for her.  We will follow-up in 9  days  Cutaneous lupus erythematosus Reports diagnosed by dermatology specialist in [place].  She ended up in collections when they mailed a bill to her prior address.  Even after she paid that they will not accept her back as a patient.  We will need to get records.  She seems to will be referred to another local dermatologist.  She was apparently on some sort of topical treatment-She wants to restart  Future Appointments  Date Time Provider [place]  05/19/2018  8:45 AM Marin Olp, MD [facility]  06/03/2018 10:00 AM [facility] None   No follow-ups on file.  Lab/Order associations: Fatigue, unspecified type - Plan: CBC with Differential/Platelet, Comprehensive metabolic panel, TSH  Epigastric abdominal pain - Plan: CBC with Differential/Platelet, Comprehensive metabolic panel, Lipase   Meds ordered this encounter  Medications  . omeprazole (PRILOSEC) 20 MG capsule    Sig: Take 1 capsule (20 mg total) by mouth daily.    Dispense:  30 capsule    Refill:  3  . sertraline (ZOLOFT) 25 MG tablet    Sig: Take 1 tablet (25 mg total) by mouth daily.    Dispense:  30 tablet    Refill:  5    Return precautions advised.  Garret Reddish, MD

## 2018-05-10 NOTE — Patient Instructions (Addendum)
Please stop by lab before you go  Start omeprazole 20mg  for reflux  Start zoloft 25mg .   Taking the medicine as directed and not missing any doses is one of the best things you can do to treat your anxiety.  Here are some things to keep in mind:  1) Side effects (stomach upset, some increased anxiety) may happen before you notice a benefit.  These side effects typically go away over time. We need to try to keep you on medicine at least 6 weeks if possible to see full benefits) 2) Changes to your dose of medicine or a change in medication all together is sometimes necessary 3) Most people need to be on medication at least 6-12 months 4) Many people will notice an improvement within two weeks but the full effect of the medication can take up to 4-6 weeks 5) Stopping the medication when you start feeling better often results in a return of symptoms 6) If you start having thoughts of hurting yourself or others after starting this medicine, call our office immediately at [phone] or seek care through 911.     Sign release of information at the check out desk for records from [facility]

## 2018-05-10 NOTE — Assessment & Plan Note (Signed)
Has been controlled without prescription in the past.  Her weight and anxiety have trended up though.  She would like to restart omeprazole 20 mg-this was sent in for her.  We will follow-up in 9 days

## 2018-05-11 ENCOUNTER — Other Ambulatory Visit: Payer: Self-pay | Admitting: Family Medicine

## 2018-05-11 ENCOUNTER — Telehealth: Payer: Self-pay | Admitting: Family Medicine

## 2018-05-11 LAB — CBC WITH DIFFERENTIAL/PLATELET
BASOS ABS: 0.1 10*3/uL (ref 0.0–0.1)
Basophils Relative: 1.3 % (ref 0.0–3.0)
EOS ABS: 0.1 10*3/uL (ref 0.0–0.7)
Eosinophils Relative: 1.7 % (ref 0.0–5.0)
HCT: 41.4 % (ref 36.0–46.0)
HEMOGLOBIN: 14.3 g/dL (ref 12.0–15.0)
Lymphocytes Relative: 30.9 % (ref 12.0–46.0)
Lymphs Abs: 2.3 10*3/uL (ref 0.7–4.0)
MCHC: 34.6 g/dL (ref 30.0–36.0)
MCV: 83.7 fl (ref 78.0–100.0)
Monocytes Absolute: 0.4 10*3/uL (ref 0.1–1.0)
Monocytes Relative: 5.6 % (ref 3.0–12.0)
Neutro Abs: 4.5 10*3/uL (ref 1.4–7.7)
Neutrophils Relative %: 60.5 % (ref 43.0–77.0)
Platelets: 296 10*3/uL (ref 150.0–400.0)
RBC: 4.95 Mil/uL (ref 3.87–5.11)
RDW: 13.2 % (ref 11.5–15.5)
WBC: 7.4 10*3/uL (ref 4.0–10.5)

## 2018-05-11 LAB — COMPREHENSIVE METABOLIC PANEL
ALBUMIN: 4.2 g/dL (ref 3.5–5.2)
ALT: 12 U/L (ref 0–35)
AST: 12 U/L (ref 0–37)
Alkaline Phosphatase: 38 U/L — ABNORMAL LOW (ref 39–117)
BILIRUBIN TOTAL: 0.3 mg/dL (ref 0.2–1.2)
BUN: 12 mg/dL (ref 6–23)
CALCIUM: 9.3 mg/dL (ref 8.4–10.5)
CO2: 26 mEq/L (ref 19–32)
CREATININE: 0.86 mg/dL (ref 0.40–1.20)
Chloride: 105 mEq/L (ref 96–112)
GFR: 74.4 mL/min (ref >60.00)
Glucose, Bld: 134 mg/dL — ABNORMAL HIGH (ref 70–99)
Potassium: 3.8 mEq/L (ref 3.5–5.1)
SODIUM: 140 meq/L (ref 135–145)
Total Protein: 7.7 g/dL (ref 6.0–8.3)

## 2018-05-11 LAB — TSH: TSH: 2.62 u[IU]/mL (ref 0.35–4.50)

## 2018-05-11 LAB — LIPASE: Lipase: 14 U/L (ref 11.0–59.0)

## 2018-05-11 MED ORDER — LORAZEPAM 0.5 MG PO TABS: 0.5000 mg | ORAL_TABLET | Freq: Three times a day (TID) | ORAL | 0 refills | Status: DC | PRN

## 2018-05-11 NOTE — Telephone Encounter (Signed)
Refilled.  Please inform patient.

## 2018-05-11 NOTE — Progress Notes (Signed)
Your CBC was normal (blood counts, infection fighting cells, platelets). Your CMET was largely  normal (kidney, liver, and electrolytes, blood sugar). Team- can you tell me if she was fasting- if she was we need to get a1c  under hyperglycemia Your lipase was normal pointing away from any pancreatic infection or irritation Your thyroid was normal.    Please also inform her that I refilled her Ativan

## 2018-05-11 NOTE — Telephone Encounter (Signed)
Copied from [place] [phone]. Topic: Quick Communication - Rx Refill/Question >> May 11, 2018 10:48 AM Scherrie Gerlach wrote: Medication: LORazepam (ATIVAN) 0.5 MG tablet Has the patient contacted their pharmacy? yes pt saw the dr yesterday and forgot to ask about this med. Would like refills please [place], [place] [phone] (Phone) [phone] (Fax)  Pt states after taking the zoloft, she could not sleep at all.

## 2018-05-12 NOTE — Telephone Encounter (Signed)
Noted. Called patient and let her know. Patient verbalized understanding.

## 2018-05-19 ENCOUNTER — Encounter: Payer: Self-pay | Admitting: Family Medicine

## 2018-05-19 ENCOUNTER — Ambulatory Visit (INDEPENDENT_AMBULATORY_CARE_PROVIDER_SITE_OTHER): Payer: 59 | Admitting: Family Medicine

## 2018-05-19 VITALS — BP 118/78 | HR 93 | Temp 97.7°F | Ht 68.0 in | Wt 181.6 lb

## 2018-05-19 DIAGNOSIS — Z0000 Encounter for general adult medical examination without abnormal findings: Secondary | ICD-10-CM

## 2018-05-19 DIAGNOSIS — E785 Hyperlipidemia, unspecified: Secondary | ICD-10-CM | POA: Insufficient documentation

## 2018-05-19 DIAGNOSIS — K219 Gastro-esophageal reflux disease without esophagitis: Secondary | ICD-10-CM | POA: Diagnosis not present

## 2018-05-19 DIAGNOSIS — F410 Panic disorder [episodic paroxysmal anxiety] without agoraphobia: Secondary | ICD-10-CM | POA: Diagnosis not present

## 2018-05-19 DIAGNOSIS — G43009 Migraine without aura, not intractable, without status migrainosus: Secondary | ICD-10-CM | POA: Diagnosis not present

## 2018-05-19 LAB — COMPREHENSIVE METABOLIC PANEL
ALT: 16 U/L (ref 0–35)
AST: 15 U/L (ref 0–37)
Albumin: 4.1 g/dL (ref 3.5–5.2)
Alkaline Phosphatase: 29 U/L — ABNORMAL LOW (ref 39–117)
BILIRUBIN TOTAL: 0.5 mg/dL (ref 0.2–1.2)
BUN: 14 mg/dL (ref 6–23)
CALCIUM: 9.2 mg/dL (ref 8.4–10.5)
CHLORIDE: 103 meq/L (ref 96–112)
CO2: 30 meq/L (ref 19–32)
CREATININE: 0.79 mg/dL (ref 0.40–1.20)
GFR: 82.05 mL/min (ref >60.00)
GLUCOSE: 86 mg/dL (ref 70–99)
Potassium: 3.7 mEq/L (ref 3.5–5.1)
SODIUM: 138 meq/L (ref 135–145)
Total Protein: 7.3 g/dL (ref 6.0–8.3)

## 2018-05-19 LAB — LIPID PANEL
CHOL/HDL RATIO: 4
Cholesterol: 173 mg/dL (ref 0–200)
HDL: 45.1 mg/dL (ref >39.00)
LDL CALC: 105 mg/dL — AB (ref 0–99)
NONHDL: 127.61
TRIGLYCERIDES: 114 mg/dL (ref 0.0–149.0)
VLDL: 22.8 mg/dL (ref 0.0–40.0)

## 2018-05-19 LAB — CBC
HCT: 42.4 % (ref 36.0–46.0)
Hemoglobin: 14.3 g/dL (ref 12.0–15.0)
MCHC: 33.7 g/dL (ref 30.0–36.0)
MCV: 86 fl (ref 78.0–100.0)
Platelets: 251 10*3/uL (ref 150.0–400.0)
RBC: 4.93 Mil/uL (ref 3.87–5.11)
RDW: 13.6 % (ref 11.5–15.5)
WBC: 6.3 10*3/uL (ref 4.0–10.5)

## 2018-05-19 MED ORDER — FLUTICASONE PROPIONATE 50 MCG/ACT NA SUSP: 2.0000 | Freq: Every day | NASAL | 3 refills | Status: AC

## 2018-05-19 MED ORDER — SUMATRIPTAN SUCCINATE 100 MG PO TABS: 100.0000 mg | ORAL_TABLET | ORAL | 3 refills | Status: DC | PRN

## 2018-05-19 MED ORDER — BUSPIRONE HCL 7.5 MG PO TABS: 7.5000 mg | ORAL_TABLET | Freq: Two times a day (BID) | ORAL | 2 refills | Status: AC | PRN

## 2018-05-19 NOTE — Progress Notes (Signed)
Phone: [phone]  Subjective:  Patient presents today for their annual physical. Chief complaint-noted.   See problem oriented charting- ROS- full  review of systems was completed and negative except for: Seasonal allergies, sinus pressure, eye itching, some anxiety  The following were reviewed and entered/updated in epic: Past Medical History:  Diagnosis Date  . ANEMIA-IRON DEFICIENCY 12/21/2008   Likely related to menstruation- now resolved after ablation    . Anxiety   . Panic attack   . Tubular adenoma of colon    11/11/13 with 5 year repeat   Patient Active Problem List   Diagnosis Date Noted  . 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 IBS (irritable bowel syndrome) 09/28/2013    Priority: Medium  . Panic attacks 09/07/2007    Priority: Medium  . Migraine headache 10/19/2006    Priority: Medium  . Tubular adenoma of colon     Priority: Low  . GERD (gastroesophageal reflux disease) 09/28/2013    Priority: Low  . Allergic rhinitis 09/07/2007    Priority: Low   Past Surgical History:  Procedure Laterality Date  . ABLATION     uterine  . CERVICAL POLYPECTOMY  09/29/2012  . CHOLECYSTECTOMY      Family History  Problem Relation Age of Onset  . Brain cancer Mother        Tumor on brainstem- not genetic  . Colon cancer Maternal Aunt   . Pancreatic cancer Neg Hx   . Stomach cancer Neg Hx     Medications- reviewed and updated Current Outpatient Medications  Medication Sig Dispense Refill  . busPIRone (BUSPAR) 7.5 MG tablet Take 1 tablet (7.5 mg total) by mouth 2 (two) times daily as needed. 60 tablet 2  . fluticasone (FLONASE) 50 MCG/ACT nasal spray Place 2 sprays into both nostrils daily. 16 g 3  . LORazepam (ATIVAN) 0.5 MG tablet Take 1 tablet (0.5 mg total) by mouth every 8 (eight) hours as needed. for anxiety 30 tablet 0  . omeprazole (PRILOSEC) 20 MG capsule Take 1 capsule (20 mg total) by mouth daily. 30 capsule 3  . SUMAtriptan  (IMITREX) 100 MG tablet Take 1 tablet (100 mg total) by mouth every 2 (two) hours as needed. Twice a day maximum 30 tablet 3   No current facility-administered medications for this visit.     Allergies-reviewed and updated Allergies  Allergen Reactions  . Citalopram Hydrobromide     Feels funny  . Lexapro [Escitalopram Oxalate]     Head feels off/funny  . Wellbutrin [Bupropion Hcl] Other (See Comments)    aggitation and figity    Social History   Social History Narrative   Separated since February 5667. 15 year old daughter and 39 year oldson  in 2016. No grandkids.       Works for [employer]- enjoys work      Hobbies: Oceanographer- likes spongebob- wants to watch with daughter    Objective: BP 118/78 (BP Location: Left Arm, Patient Position: Sitting, Cuff Size: Normal)   Pulse 93   Temp 97.7 F (36.5 C) (Oral)   Ht 5\' 8"  (1.727 m)   Wt 181 lb 9.6 oz (82.4 kg)   SpO2 97%   BMI 27.61 kg/m  Gen: NAD, resting comfortably HEENT: Mucous membranes are moist. Oropharynx normal Neck: no thyromegaly CV: RRR no murmurs rubs or gallops Lungs: CTAB no crackles, wheeze, rhonchi Abdomen: soft/nontender/nondistended/normal bowel sounds. No rebound or guarding.  Ext: no edema Skin: warm, dry Neuro: grossly  normal, moves all extremities, PERRLA  Assessment/Plan:  52 y.o. female presenting for annual physical.  Health Maintenance counseling: 1. Anticipatory guidance: Patient counseled regarding regular dental exams -q6 months, eye exams - yearly, wearing seatbelts.  2. Risk factor reduction:  Advised patient of need for regular exercise and diet rich and fruits and vegetables to reduce risk of heart attack and stroke. Exercise- going with daughter- going most days Monday through thursday. Diet-we discussed losing 10-20 pounds. Drinks sweet tea -encouraged to cut out  Wt Readings from Last 3 Encounters:  05/19/18 181 lb 9.6 oz (82.4 kg)  05/10/18 181 lb 6.4 oz (82.3 kg)  09/02/17 179 lb  (81.2 kg)  3. Immunizations/screenings/ancillary studies-discussed Shingrix when she turns 52  Immunization History  Administered Date(s) Administered  . Hepatitis B 08/02/2010, 09/11/2010  . Influenza Whole 09/28/2006, 11/28/2010  . Td 12/21/2008  . Tdap 12/21/2008   4. Cervical cancer screening- planned today with gynecology- last in 08/2015 Dr. Corinna Capra 5. Breast cancer screening-  breast exam with [facility] and mammogram - planned June 5th 6. Colon cancer screening - discussed referral when turns 77- she had in 2014 and had polyps with Dr. Olevia Perches 7. Skin cancer screening- likely will get set up with new dermatologist in long run. advised regular sunscreen use. Denies worrisome, changing, or new skin lesions- occasionally sees rash from lupus 8. Birth control/STD check- has had ablation. Uses protection everytime with sex.   Status of chronic or acute concerns   Seasonal allergies, sinus pressure, eye itching- using otc antihistamine- will add flonase given some breakthrough  Still waiting on records for cutaneous lupus erythematous- she didn't sign last time  Panic attacks PHQ 9 of 2. GAD7 over 10 just a few days ago.   Anxiety remains an issue.  She was not able to tolerate those sertraline 25 mg-it makes her hyper and anxious.  She remains on Ativan on almost a daily basis.  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  Multiple failures in the past- Lexapro, citalopram, now Zoloft. Plan Lets try buspirone 7.5mg  up to twice a day. You can use just as needed but if anxiety gets bad you can use twice a day scheduled.   GERD (gastroesophageal reflux disease) GERD- we placed her back on omeprazole 20 mg last visit and her reflux has resolved  Migraine headache Patient remains on sumatriptan as needed for migraines- about  once a month  Hyperlipidemia, unspecified Lab Results  Component Value Date   CHOL 174 12/14/2008   HDL 44.6 12/14/2008   LDLCALC 107 (H) 12/14/2008   TRIG 112 12/14/2008   CHOLHDL 3.9 CALC 12/14/2008  No recent update of lipids-we will update today with labs  Future Appointments  Date Time Provider [place]  06/03/2018 10:00 AM [facility] CLINIC [facility] None  06/03/2018 10:50 AM [facility] DIAG [place] 2 [facility]  06/03/2018 11:00 AM [facility] [place] 2 [facility]   Lets check in in 2 months to see how you are doing with your anxiety  Lab/Order associations: Preventative health care - Plan: CBC, Comprehensive metabolic panel, Lipid panel  Hyperlipidemia, unspecified hyperlipidemia type - Plan: CBC, Comprehensive metabolic panel, Lipid panel  Meds ordered this encounter  Medications  . SUMAtriptan (IMITREX) 100 MG tablet    Sig: Take 1 tablet (100 mg total) by mouth every  2 (two) hours as needed. Twice a day maximum    Dispense:  30 tablet    Refill:  3    may only fill 10 at a time  . busPIRone (BUSPAR) 7.5 MG tablet    Sig: Take 1 tablet (7.5 mg total) by mouth 2 (two) times daily as needed.    Dispense:  60 tablet    Refill:  2  . fluticasone (FLONASE) 50 MCG/ACT nasal spray    Sig: Place 2 sprays into both nostrils daily.    Dispense:  16 g    Refill:  3    Return precautions advised.  Garret Reddish, MD

## 2018-05-19 NOTE — Assessment & Plan Note (Addendum)
PHQ 9 of 2. GAD7 over 10 just a few days ago.   Anxiety remains an issue.  She was not able to tolerate those sertraline 25 mg-it makes her hyper and anxious.  She remains on Ativan on almost a daily basis.  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  Multiple failures in the past- Lexapro, citalopram, now Zoloft. Plan Lets try buspirone 7.5mg  up to twice a day. You can use just as needed but if anxiety gets bad you can use twice a day scheduled.

## 2018-05-19 NOTE — Assessment & Plan Note (Signed)
Lab Results  Component Value Date   CHOL 174 12/14/2008   HDL 44.6 12/14/2008   LDLCALC 107 (H) 12/14/2008   TRIG 112 12/14/2008   CHOLHDL 3.9 CALC 12/14/2008  No recent update of lipids-we will update today with labs

## 2018-05-19 NOTE — Patient Instructions (Addendum)
Lets try buspirone 7.5mg  up to twice a day for your general anxiety. You can use just as needed but if anxiety gets bad you can use twice a day scheduled. If you have a panic attack you can use ativan but really would prefer for you to use this once a week or less  Lets check in in 2 months to see how you are doing with your anxiety  Sign release of information at the check out desk for records from last dermatologist- including all labs  Please stop by lab before you go

## 2018-05-19 NOTE — Progress Notes (Signed)
Please let us know by responding to this when you get this mychart message and let us know if you understand the results/directions  Your CBC was normal (blood counts, infection fighting cells, platelets). Your CMET was normal (kidney, liver, and electrolytes, blood sugar).  Your cholesterol was slightly high- I want you to work on regular exercise and healthy eating instead of adding cholesterol medicine right now.   Also check with her if they had the buspirone and if she has tried it yet.

## 2018-05-19 NOTE — Assessment & Plan Note (Signed)
Patient remains on sumatriptan as needed for migraines- about once a month

## 2018-05-19 NOTE — Assessment & Plan Note (Signed)
GERD- we placed her back on omeprazole 20 mg last visit and her reflux has resolved

## 2018-06-03 ENCOUNTER — Other Ambulatory Visit: Payer: 59

## 2018-06-03 ENCOUNTER — Other Ambulatory Visit: Payer: Self-pay | Admitting: Obstetrics and Gynecology

## 2018-06-03 ENCOUNTER — Encounter (HOSPITAL_COMMUNITY): Payer: Self-pay | Admitting: *Deleted

## 2018-06-03 ENCOUNTER — Ambulatory Visit (HOSPITAL_COMMUNITY): Payer: No Typology Code available for payment source

## 2018-06-03 DIAGNOSIS — M79621 Pain in right upper arm: Secondary | ICD-10-CM

## 2018-06-08 ENCOUNTER — Other Ambulatory Visit: Payer: Self-pay | Admitting: Obstetrics and Gynecology

## 2018-06-08 DIAGNOSIS — M79621 Pain in right upper arm: Secondary | ICD-10-CM

## 2018-06-21 ENCOUNTER — Other Ambulatory Visit: Payer: Self-pay | Admitting: Family Medicine

## 2018-06-23 ENCOUNTER — Ambulatory Visit
Admission: RE | Admit: 2018-06-23 | Discharge: 2018-06-23 | Disposition: A | Discharge status: Home / Self Care | Payer: 59 | Source: Ambulatory Visit | Attending: Obstetrics and Gynecology | Admitting: Obstetrics and Gynecology

## 2018-06-23 ENCOUNTER — Other Ambulatory Visit: Payer: Self-pay | Admitting: Obstetrics and Gynecology

## 2018-06-23 ENCOUNTER — Ambulatory Visit: Payer: 59

## 2018-06-23 DIAGNOSIS — M79621 Pain in right upper arm: Secondary | ICD-10-CM

## 2018-07-25 ENCOUNTER — Other Ambulatory Visit: Payer: Self-pay | Admitting: Family Medicine

## 2018-07-29 ENCOUNTER — Telehealth: Payer: Self-pay | Admitting: Family Medicine

## 2018-07-29 NOTE — Telephone Encounter (Signed)
Copied from [place] [phone]. Topic: Quick Communication - Rx Refill/Question >> Jul 29, 2018  7:42 AM Yvette Rack wrote: Medication: LORazepam (ATIVAN) 0.5 MG tablet  Has the patient contacted their pharmacy? Yes.  She called pharmacy on Sunday (Agent: If no, request that the patient contact the pharmacy for the refill.) (Agent: If yes, when and what did the pharmacy advise?) that they was still waiting on the provider to approve the medicine  Preferred Pharmacy (with phone number or street name): [place], [place] [phone] (Phone) [phone] (Fax)      Agent: Please be advised that RX refills may take up to 3 business days. We ask that you follow-up with your pharmacy.

## 2018-07-29 NOTE — Telephone Encounter (Signed)
Patient calling and states that the pharmacy states they have not received a prescription for LORazepam (ATIVAN) 0.5 MG tablet. Please advise.

## 2018-07-29 NOTE — Telephone Encounter (Signed)
Refill on Lorazepam completed per Dr. Ansel Bong nurse.

## 2018-07-30 NOTE — Telephone Encounter (Signed)
Called patient pharmacy and they stated the prescription has already been filled and picked up by patient.

## 2018-08-24 LAB — HM COLONOSCOPY

## 2018-09-07 ENCOUNTER — Encounter: Payer: Self-pay | Admitting: Family Medicine

## 2018-09-17 ENCOUNTER — Ambulatory Visit: Payer: 59 | Admitting: Family Medicine

## 2019-01-02 ENCOUNTER — Encounter: Payer: Self-pay | Admitting: Gastroenterology

## 2019-01-14 ENCOUNTER — Telehealth: Payer: Self-pay | Admitting: Family Medicine

## 2019-01-14 NOTE — Telephone Encounter (Signed)
Is it ok to put her in a same day slot on Monday?  Copied from [place] [phone]. Topic: Appointment Scheduling - Scheduling Inquiry for Clinic >> Jan 14, 2019 10:30 AM Lennox Solders wrote: Reason for CRM: pt will be in town and requesting an appt with dr hunter on Monday for medication follow. Dr Retail banker only has same day slot.

## 2019-01-17 ENCOUNTER — Telehealth: Payer: Self-pay | Admitting: Family Medicine

## 2019-01-17 MED ORDER — LORAZEPAM 0.5 MG PO TABS: 0.5000 mg | ORAL_TABLET | Freq: Three times a day (TID) | ORAL | 0 refills | Status: DC | PRN

## 2019-01-17 NOTE — Addendum Note (Signed)
Addended by: Marin Olp on: 01/17/2019 07:53 PM   Modules accepted: Orders

## 2019-01-17 NOTE — Telephone Encounter (Signed)
Patient declined to schedule an appointment when I spoke with her as she does not know her schedule and will not be back until into February. She stated she would give [place] a call when she gets back in February. I did load the new pharmacy into the preferred pharmacies.

## 2019-01-17 NOTE — Telephone Encounter (Signed)
I recommended 2 month follow up in may of 2019. It has been 8 months. No further refills without an office visit. She just filled 15 days ago #30 of this medication. I will provide 20 tablets- but no further refill without a visit- please let her know.

## 2019-01-17 NOTE — Telephone Encounter (Signed)
MEDICATION: LORazepam (ATIVAN) 0.5 MG tablet  PHARMACY:  [id number] - [place], [place] [address]  IS THIS A 90 DAY SUPPLY : no  IS PATIENT OUT OF MEDICATION: no  IF NOT; HOW MUCH IS LEFT: 6 left  LAST APPOINTMENT DATE: @1 /17/2020  NEXT APPOINTMENT DATE: patient will call back to schedule follow up appt. She is training for a new job in [place] and comes back monthly  OTHER COMMENTS: I spoke with Dr. Yong Channel and he is willing to refill the medication (the pharmacy may need a new prescription due to the transfer of the medication to the [facility] store.   **Let patient know to contact pharmacy at the end of the day to make sure medication is ready. **  ** Please notify patient to allow 48-72 hours to process**  **Encourage patient to contact the pharmacy for refills or they can request refills through [facility]**  Copied from [place] [phone]. Topic: Appointment Scheduling - Scheduling Inquiry for Clinic >> Jan 14, 2019 10:30 AM Lennox Solders wrote: Reason for CRM: pt will be in town and requesting an appt with dr hunter on Monday for medication follow. Dr Yong Channel only has same day slot. >> Jan 17, 2019 12:03 PM Yvette Rack wrote: Pt back about getting her in for an appt today for med refill please call pt at [phone] >> Jan 17, 2019 12:08 PM Carole Binning B wrote: LVM for patient to call back to schedule IF an appointment is available for today or we can schedule for tomorrow if the slots are still available. Okay for [facility] to schedule into sameday slot. >> Jan 17, 2019 12:19 PM Conception Chancy, NT wrote: Patient is calling back in regards to this. She states that she goes to [place] today and can not come tomorrow.

## 2019-01-17 NOTE — Telephone Encounter (Signed)
Donna Hancock- Please go ahead and have patient schedule appointment first then I will refill. I will send 30 days in- please load this new pharmacy for me

## 2019-01-18 NOTE — Telephone Encounter (Signed)
Contacted patient and explained that you only provided 20 tablets and are unable to further refill without a visit. Patient stated she understood and would reach out once she knows her schedule in February for her [employer] job.

## 2019-01-18 NOTE — Telephone Encounter (Signed)
Please advise further if needed. Close note once completed

## 2019-01-18 NOTE — Telephone Encounter (Signed)
Thanks for helping. No further action needed.

## 2019-02-28 ENCOUNTER — Other Ambulatory Visit: Payer: Self-pay | Admitting: Family Medicine

## 2019-02-28 MED ORDER — OMEPRAZOLE 20 MG PO CPDR: 20.0000 mg | DELAYED_RELEASE_CAPSULE | Freq: Every day | ORAL | 3 refills | Status: DC

## 2019-02-28 MED ORDER — LORAZEPAM 0.5 MG PO TABS: 0.5000 mg | ORAL_TABLET | Freq: Three times a day (TID) | ORAL | 0 refills | Status: DC | PRN

## 2019-02-28 NOTE — Telephone Encounter (Signed)
Ativan refill request  Along with a Prilosec.

## 2019-02-28 NOTE — Telephone Encounter (Signed)
See note

## 2019-02-28 NOTE — Telephone Encounter (Signed)
Copied from [place] [phone]. Topic: Quick Communication - Rx Refill/Question >> Feb 28, 2019  2:05 PM Leward Quan A wrote: Medication: LORazepam (ATIVAN) 0.5 MG tablet, omeprazole (PRILOSEC) 20 MG capsule  Patient has 2 pills left and appointment scheduled  Has the patient contacted their pharmacy? Yes.   (Agent: If no, request that the patient contact the pharmacy for the refill.) (Agent: If yes, when and what did the pharmacy advise?)  Preferred Pharmacy (with phone number or street name): [place], [place] [phone] (Phone) [phone] (Fax)    Agent: Please be advised that RX refills may take up to 3 business days. We ask that you follow-up with your pharmacy.

## 2019-03-07 ENCOUNTER — Telehealth: Payer: Self-pay | Admitting: Family Medicine

## 2019-03-07 NOTE — Telephone Encounter (Signed)
See note

## 2019-03-07 NOTE — Telephone Encounter (Signed)
Copied from [place] [phone]. Topic: General - Other >> Mar 07, 2019  1:03 PM Lennox Solders wrote: Reason for CRM: pt is calling and she has been taking lorazepam 1.5 pills a day. Pt has an appt on 03-23-2019. Pt is having a anxiety flare up and only has 2.5 pills left. [facility] [place]. Pt is on the wait list

## 2019-03-07 NOTE — Telephone Encounter (Signed)
Please see phone note 01/17/2019- we have done several refills for her now. She waited to schedule a visit until 02/28/2019- she will need an office visit or to stretch current pills out until visit on 25th

## 2019-03-07 NOTE — Telephone Encounter (Signed)
Okay for refill? Please advise 

## 2019-03-08 NOTE — Telephone Encounter (Signed)
Spoke to pt told her no further refills till seen. Offered pt earlier appt. Pt verbalized understanding and said she can come in any time this week except today. Pt scheduled for tomorrow at 10:00 AM with Dr. Yong Channel. Pt verbalized understanding.

## 2019-03-09 ENCOUNTER — Ambulatory Visit (INDEPENDENT_AMBULATORY_CARE_PROVIDER_SITE_OTHER): Payer: BLUE CROSS/BLUE SHIELD | Admitting: Family Medicine

## 2019-03-09 ENCOUNTER — Other Ambulatory Visit: Payer: Self-pay

## 2019-03-09 ENCOUNTER — Encounter: Payer: Self-pay | Admitting: Family Medicine

## 2019-03-09 VITALS — BP 120/78 | HR 70 | Temp 98.7°F | Ht 68.0 in | Wt 186.2 lb

## 2019-03-09 DIAGNOSIS — G43009 Migraine without aura, not intractable, without status migrainosus: Secondary | ICD-10-CM | POA: Diagnosis not present

## 2019-03-09 DIAGNOSIS — R1013 Epigastric pain: Secondary | ICD-10-CM

## 2019-03-09 DIAGNOSIS — F410 Panic disorder [episodic paroxysmal anxiety] without agoraphobia: Secondary | ICD-10-CM | POA: Diagnosis not present

## 2019-03-09 DIAGNOSIS — R140 Abdominal distension (gaseous): Secondary | ICD-10-CM

## 2019-03-09 DIAGNOSIS — K219 Gastro-esophageal reflux disease without esophagitis: Secondary | ICD-10-CM

## 2019-03-09 LAB — CBC
HCT: 41.1 % (ref 36.0–46.0)
HEMOGLOBIN: 14.1 g/dL (ref 12.0–15.0)
MCHC: 34.2 g/dL (ref 30.0–36.0)
MCV: 86.1 fl (ref 78.0–100.0)
Platelets: 277 10*3/uL (ref 150.0–400.0)
RBC: 4.78 Mil/uL (ref 3.87–5.11)
RDW: 13.5 % (ref 11.5–15.5)
WBC: 7.4 10*3/uL (ref 4.0–10.5)

## 2019-03-09 LAB — COMPREHENSIVE METABOLIC PANEL
ALBUMIN: 4.5 g/dL (ref 3.5–5.2)
ALT: 28 U/L (ref 0–35)
AST: 20 U/L (ref 0–37)
Alkaline Phosphatase: 37 U/L — ABNORMAL LOW (ref 39–117)
BUN: 13 mg/dL (ref 6–23)
CO2: 28 mEq/L (ref 19–32)
Calcium: 9.3 mg/dL (ref 8.4–10.5)
Chloride: 103 mEq/L (ref 96–112)
Creatinine, Ser: 0.79 mg/dL (ref 0.40–1.20)
GFR: 76.94 mL/min (ref >60.00)
GLUCOSE: 78 mg/dL (ref 70–99)
POTASSIUM: 4.4 meq/L (ref 3.5–5.1)
SODIUM: 137 meq/L (ref 135–145)
TOTAL PROTEIN: 7.4 g/dL (ref 6.0–8.3)
Total Bilirubin: 0.4 mg/dL (ref 0.2–1.2)

## 2019-03-09 LAB — LIPASE: Lipase: 11 U/L (ref 11.0–59.0)

## 2019-03-09 MED ORDER — LORAZEPAM 0.5 MG PO TABS: 0.5000 mg | ORAL_TABLET | Freq: Two times a day (BID) | ORAL | 5 refills | Status: DC | PRN

## 2019-03-09 NOTE — Progress Notes (Signed)
Phone [phone]   Subjective:  Donna Hancock is a 53 y.o. year old very pleasant female patient who presents for/with See problem oriented charting ROS-no unintentional weight loss.  No reported night sweats.  No chest pain.  Does have some abdominal pain and bloating in the epigastric area in particular-worse with anxiety and improved with Ativan  Past Medical History-  Patient Active Problem List   Diagnosis Date Noted  . Hyperlipidemia, unspecified 05/19/2018    Priority: Medium  . 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 IBS (irritable bowel syndrome) 09/28/2013    Priority: Medium  . Panic attacks 09/07/2007    Priority: Medium  . Migraine headache 10/19/2006    Priority: Medium  . Tubular adenoma of colon     Priority: Low  . GERD (gastroesophageal reflux disease) 09/28/2013    Priority: Low  . Allergic rhinitis 09/07/2007    Priority: Low    Medications- reviewed and updated Current Outpatient Medications  Medication Sig Dispense Refill  . fluticasone (FLONASE) 50 MCG/ACT nasal spray Place 2 sprays into both nostrils daily. 16 g 3  . LORazepam (ATIVAN) 0.5 MG tablet Take 1 tablet (0.5 mg total) by mouth 2 (two) times daily as needed. for anxiety. 30 tablet 5  . omeprazole (PRILOSEC) 20 MG capsule Take 1 capsule (20 mg total) by mouth daily. 30 capsule 3  . SUMAtriptan (IMITREX) 100 MG tablet Take 1 tablet (100 mg total) by mouth every 2 (two) hours as needed. Twice a day maximum 30 tablet 3   No current facility-administered medications for this visit.      Objective:  BP 120/78 (BP Location: Left Arm, Patient Position: Sitting, Cuff Size: Normal)   Pulse 70   Temp 98.7 F (37.1 C) (Oral)   Ht 5\' 8"  (1.727 m)   Wt 186 lb 3.2 oz (84.5 kg)   SpO2 98%   BMI 28.31 kg/m  Gen: NAD CV: RRR no murmurs rubs or gallops Lungs: CTAB no crackles, wheeze, rhonchi Abdomen: soft/very mild diffuse tenderness-moderate in epigastric area  /nondistended/normal bowel sounds. No rebound or guarding.  Ext: no edema Skin: warm, dry Neuro: Normal gait and speech Psych: Anxious appearing    Assessment and Plan   # panic attacks/abdominal pain and bloating S: patient with history of multiple panic attacks. Has failed multiple SSRI.   Moved to [place] and that was not a good decision- had to move back and sharing a room with her an daughter and that is hard. Has been hard to find builder jobs- may be looking more in [place]. Not able to get her old job back and that is hrad for her.   Has had more anxiety. feels like something stuck in chest or throat- this resolves with taking the ativan.    Stomach seems to bother her. Up 5 lbs over last 9 months or so. Used to be 165. Tends to eat sweets to help cope . Pain comes and goes- worse after meals. Feels bloated  Would be interested in counseling but worried about finances.   Over last 8 months has used 240 pills which is 30 per day overall but she states use has been higher in last few months.   phq9 of 4.  A/P: 53 year old female with history of panic attacks/anxiety-typically using Ativan 0.5 mg up to 3 times a day.  I asked her to try to keep this on average twice a day and change dosing to twice daily.  She is  getting some abdominal pain and bloating sensation when anxiety is high-this is resolved with taking Ativan.  I doubt intra-abdominal pathology.  She is concerned about potential for cancer.  Fortunately she is up-to-date on her cancer screening-so we need to get a copy of her last Pap smear.  I will get a CBC, CMP, lipase for some reassurance but we discussed this does not firmly rule out any cancers-once again believe she is low risk though -Status post cholecystectomy -Abdominal exam benign today  # GERD S:omeprazole has been helpful for reflux- takes daily. Indigestion portoin has resolved  A/P:  Stable. Continue current medications.  I do not believe we need to  increase dose as the bloating sensation seems to be more anxiety related.  # Migraines S: fortunately no recent migraines- still has imitrex- used once in 3 months.   A/P:  Stable. Continue current medications.  Willing to refill sumatriptan up to a year  Future Appointments  Date Time Provider IXL  03/23/2019  8:20 AM Marin Olp, MD [facility]   Lab/Order associations: Panic attacks  Migraine without aura and without status migrainosus, not intractable  Gastroesophageal reflux disease without esophagitis  Bloating - Plan: CBC, Comprehensive metabolic panel, Lipase  Epigastric abdominal pain  Meds ordered this encounter  Medications  . LORazepam (ATIVAN) 0.5 MG tablet    Sig: Take 1 tablet (0.5 mg total) by mouth 2 (two) times daily as needed. for anxiety.    Dispense:  30 tablet    Refill:  5    Return precautions advised.  Garret Reddish, MD

## 2019-03-09 NOTE — Patient Instructions (Addendum)
Health Maintenance Due  Topic Date Due  . PAP SMEAR- Sign release of information at the check out desk for last pap smear 09/05/2018  . TETANUS/TDAP - declines today 12/21/2018   Please stop by lab before you go If you do not have mychart- we will call you about results within 5 business days of [place] receiving them.  If you have mychart- we will send your results within 3 business days of [place] receiving them.  If abnormal or we want to clarify a result, we will call or mychart you to make sure you receive the message.  If you have questions or concerns or don't hear within 5-7 days, please send [place] a message or call us.   Refilled anxiety medicine for 6 months- can schedule physical in 6 months to check in

## 2019-03-11 ENCOUNTER — Ambulatory Visit: Payer: Self-pay

## 2019-03-11 NOTE — Telephone Encounter (Signed)
Incoming call from Patient.  With complaint of loosing her voice and becoming hoarse.  Reports having a cough and runny nose and nasal  Congestion.  Denies fever.  Breathing is ok.  Denies smoking.  Recommended Claritin Patient states Zyrtec does not work for her.Patient states that she will call back  On Monday if Sx worsen.     Donna Hancock Female, 53 y.o., 1966/07/30 MRN:  [id number] Phone:  [phone] Donna Hancock) PCP:  Marin Olp, MD Coverage:  BLUE CROSS BLUE SHIELD/BCBS OTHER Next Appt With Family Medicine 03/23/2019 at 8:20 AM Message from Scherrie Gerlach sent at 03/11/2019 9:20 AM EDT   Summary: advice   Pt states she started losing her voice yesterday, and today is getting worse. Pt has allergies, and wants to know if you think there is anything can be done, like her getting a shot, like she has in the past. Pt has runny nose and watery eyes. Pt has to leave town at 2 pm and needs to know asap.        Call History    Type Contact Phone  03/11/2019 09:15 AM Phone (Incoming) Rashanna, Christiana (Self) [phone] (H)  User: Scherrie Gerlach    Reason for Disposition . Hoarseness persists > 2 weeks  Answer Assessment - Initial Assessment Questions 1. DESCRIPTION: "Describe your voice."     Voice sometimes 2. ONSET: "When did the hoarseness begin?"     yesterday 3. COUGH: "Is there a cough?" If so, ask: "How bad?"     yes 4. FEVER: "Do you have a fever?" If so, ask: "What is your temperature, how was it measured, and when did it start?"    denies 5. RESPIRATORY STATUS: "Describe your breathing."      denies 6. ALLERGIES: "Any allergy symptoms?" If so, ask: "What are they?"     yes 7. IRRITANTS: "Do you smoke?" "Have you been exposed to any irritating fumes?"    denies 8. CAUSE: "What do you think is causing the hoarseness?"     allergies 9. OTHER SYMPTOMS: "Do you have any other symptoms?" (e.g., sore throat, swelling, foreign body, rash)     *denies 10. PREGNANCY: "Is  there any chance you are pregnant?" "When was your last menstrual period?"       *No Answer*  Protocols used: [id number]

## 2019-03-15 ENCOUNTER — Encounter (INDEPENDENT_AMBULATORY_CARE_PROVIDER_SITE_OTHER): Payer: BLUE CROSS/BLUE SHIELD | Admitting: Family Medicine

## 2019-03-15 DIAGNOSIS — J40 Bronchitis, not specified as acute or chronic: Secondary | ICD-10-CM

## 2019-03-16 NOTE — Telephone Encounter (Signed)
Please see message . Thank you .

## 2019-03-17 MED ORDER — GUAIFENESIN-CODEINE 100-10 MG/5ML PO SOLN: 5.0000 mL | Freq: Four times a day (QID) | ORAL | 0 refills | Status: DC | PRN

## 2019-03-17 NOTE — Telephone Encounter (Signed)
.   A total of 13 minutes were spent by me to personally review the patient-generated inquiry, review patient records and data pertinent to assessment of the patient's problem, develop a management plan including generation of prescriptions and/or orders, and on subsequent communication with the patient through secure the MyChart portal service.  . There is no separately reported E/M service related to this service in the past 7 days nor does the patient have an upcoming soonest available appointment for this issue. This work was completed in less than 7 days. ?She had a visit within 7 days but it was not related to current service- her cough and congestion were not mentioned at time of last visit . The patient verbally consented to this service. Please see her response by mychart. Patient counseled regarding the need for in-person exam for certain conditions and was advised to call the office if any changing or worsening symptoms occur. ?

## 2019-03-17 NOTE — Telephone Encounter (Signed)
Cough since last weekend No other sx. They told pt she had bronchitis. No travel/no none exposure No fever No body aches No SOB, just from coughing They did not recommend isolation Do not know about tessalon pearls. Pt prefers Tussionex, it has always helped. No other symptoms.  Pt following up.  Was not sure message went through.  [facility] [address] [place] - [place] [phone] (Phone) [phone] (Fax)

## 2019-03-23 ENCOUNTER — Ambulatory Visit: Payer: 59 | Admitting: Family Medicine

## 2019-05-13 ENCOUNTER — Ambulatory Visit: Payer: BLUE CROSS/BLUE SHIELD | Admitting: Family Medicine

## 2019-05-13 NOTE — Patient Instructions (Signed)
Health Maintenance Due  Topic Date Due  . PAP SMEAR-Modifier  09/05/2018  . TETANUS/TDAP  12/21/2018    Depression screen PHQ 2/9 03/09/2019 05/19/2018  Decreased Interest 0 0  Down, Depressed, Hopeless 1 0  PHQ - 2 Score 1 0  Altered sleeping 1 1  Tired, decreased energy 0 1  Change in appetite 0 0  Feeling bad or failure about yourself  1 0  Trouble concentrating 1 0  Moving slowly or fidgety/restless 0 0  Suicidal thoughts 0 0  PHQ-9 Score 4 2  Difficult doing work/chores Not difficult at all Not difficult at all

## 2019-05-13 NOTE — Progress Notes (Signed)
Los no charge- patient states symptoms resolved and wanted to cancel visit

## 2019-06-13 ENCOUNTER — Other Ambulatory Visit: Payer: Self-pay | Admitting: Family Medicine

## 2019-06-13 NOTE — Telephone Encounter (Signed)
Last OV 03/09/19 Last refill 03/09/19 #30/5 - take BID prn Next OV not scheduled

## 2019-06-13 NOTE — Telephone Encounter (Signed)
Copied from [place] [phone]. Topic: Quick Communication - Rx Refill/Question >> Jun 13, 2019 12:43 PM Oneta Rack wrote: Medication: LORazepam (ATIVAN) 0.5 MG tablet    Preferred Pharmacy (with phone number or street name):  [place], [place] [phone] (Phone) [phone] (Fax)    Agent: Please be advised that RX refills may take up to 3 business days. We ask that you follow-up with your pharmacy.

## 2019-06-13 NOTE — Telephone Encounter (Signed)
May do #60 with 1 refill- I tried to sign this electronically but it would not let me (may be [facility] issue about department listed)

## 2019-06-14 ENCOUNTER — Other Ambulatory Visit: Payer: Self-pay | Admitting: Family Medicine

## 2019-06-14 NOTE — Telephone Encounter (Signed)
F.Y.I  I sent it back to you.  It did not go through

## 2019-06-14 NOTE — Telephone Encounter (Signed)
Called pharmacy and left verbal auth on VM.

## 2019-07-28 ENCOUNTER — Other Ambulatory Visit: Payer: Self-pay | Admitting: Physical Therapy

## 2019-07-28 NOTE — Telephone Encounter (Signed)
Copied from [place] [id number]. Topic: General - Other >> Jul 28, 2019  9:43 AM Oneta Rack wrote: Patient wanted PCP to be aware due to insurance changing to [facility] local she has to now see [facility].

## 2019-07-29 NOTE — Telephone Encounter (Signed)
Spoke to pt and she stated that Dr. Yong Channel can not be her PCP anymore. Pt notified that Dr. Yong Channel will be informed of update. Dr. Yong Channel has been removed as pt PCP has been removed   FYI

## 2019-07-31 ENCOUNTER — Other Ambulatory Visit: Payer: Self-pay | Admitting: Family Medicine

## 2019-08-01 NOTE — Telephone Encounter (Signed)
Please tell her thank you for the opportunity to be her physician over the last 4 years-it was an [employer].  I certainly wish her the best.

## 2019-08-03 NOTE — Addendum Note (Signed)
Addended by: Jasper Loser on: 08/03/2019 03:06 PM   Modules accepted: Orders

## 2019-08-03 NOTE — Telephone Encounter (Signed)
Pt called in and would like to know if Dr can call in the enoughLORazepam (ATIVAN) 0.5 MG tablet [[id number]]  Until she can get her new pt appt on 8/24?  Pharmacy - [facility] in [place]

## 2019-08-03 NOTE — Telephone Encounter (Signed)
Forwarding to Dr. Hunter.  

## 2019-08-03 NOTE — Telephone Encounter (Signed)
See RX request

## 2019-08-04 MED ORDER — LORAZEPAM 0.5 MG PO TABS: 0.5000 mg | ORAL_TABLET | Freq: Two times a day (BID) | ORAL | 0 refills | Status: DC | PRN

## 2019-08-04 MED ORDER — LORAZEPAM 0.5 MG PO TABS: 0.5000 mg | ORAL_TABLET | Freq: Two times a day (BID) | ORAL | 0 refills | Status: AC | PRN

## 2019-08-04 MED ORDER — SUMATRIPTAN SUCCINATE 100 MG PO TABS: 100.0000 mg | ORAL_TABLET | ORAL | 0 refills | Status: DC | PRN

## 2019-08-04 NOTE — Addendum Note (Signed)
Addended by: Marin Olp on: 08/04/2019 07:49 AM   Modules accepted: Orders

## 2020-05-27 ENCOUNTER — Emergency Department
Admission: EM | Admit: 2020-05-27 | Discharge: 2020-05-27 | Disposition: A | Discharge status: Home / Self Care | Payer: BLUE CROSS/BLUE SHIELD | Attending: Emergency Medicine | Admitting: Emergency Medicine

## 2020-05-27 DIAGNOSIS — R221 Localized swelling, mass and lump, neck: Secondary | ICD-10-CM | POA: Diagnosis present

## 2020-05-27 DIAGNOSIS — Z5321 Procedure and treatment not carried out due to patient leaving prior to being seen by health care provider: Secondary | ICD-10-CM | POA: Insufficient documentation

## 2020-05-27 NOTE — ED Triage Notes (Signed)
FIRST NURSE NOTE:  Pt to lobby desk states she thinks the swelling in her neck went down, pt states she had a bad panic attack and never had neck swelling.  Pt states she is going to go home, Advised pt to return if she needed to be evaluated. Pt verbalized understanding.

## 2020-05-30 ENCOUNTER — Other Ambulatory Visit: Payer: Self-pay

## 2020-05-30 ENCOUNTER — Emergency Department (HOSPITAL_COMMUNITY)
Admission: EM | Admit: 2020-05-30 | Discharge: 2020-05-31 | Disposition: A | Discharge status: Home / Self Care | Payer: BLUE CROSS/BLUE SHIELD | Attending: Emergency Medicine | Admitting: Emergency Medicine

## 2020-05-30 ENCOUNTER — Emergency Department (HOSPITAL_COMMUNITY): Payer: BLUE CROSS/BLUE SHIELD

## 2020-05-30 ENCOUNTER — Encounter (HOSPITAL_COMMUNITY): Payer: Self-pay | Admitting: *Deleted

## 2020-05-30 DIAGNOSIS — R590 Localized enlarged lymph nodes: Secondary | ICD-10-CM | POA: Diagnosis not present

## 2020-05-30 DIAGNOSIS — E041 Nontoxic single thyroid nodule: Secondary | ICD-10-CM | POA: Insufficient documentation

## 2020-05-30 DIAGNOSIS — F419 Anxiety disorder, unspecified: Secondary | ICD-10-CM | POA: Diagnosis not present

## 2020-05-30 DIAGNOSIS — Z79899 Other long term (current) drug therapy: Secondary | ICD-10-CM | POA: Insufficient documentation

## 2020-05-30 DIAGNOSIS — F410 Panic disorder [episodic paroxysmal anxiety] without agoraphobia: Secondary | ICD-10-CM

## 2020-05-30 LAB — CBC WITH DIFFERENTIAL/PLATELET
Abs Immature Granulocytes: 0.05 10*3/uL (ref 0.00–0.07)
Basophils Absolute: 0.1 10*3/uL (ref 0.0–0.1)
Basophils Relative: 1 %
Eosinophils Absolute: 0.2 10*3/uL (ref 0.0–0.5)
Eosinophils Relative: 2 %
HCT: 44.6 % (ref 36.0–46.0)
Hemoglobin: 14.9 g/dL (ref 12.0–15.0)
Immature Granulocytes: 1 %
Lymphocytes Relative: 31 %
Lymphs Abs: 2.4 10*3/uL (ref 0.7–4.0)
MCH: 29.6 pg (ref 26.0–34.0)
MCHC: 33.4 g/dL (ref 30.0–36.0)
MCV: 88.5 fL (ref 80.0–100.0)
Monocytes Absolute: 0.6 10*3/uL (ref 0.1–1.0)
Monocytes Relative: 8 %
Neutro Abs: 4.4 10*3/uL (ref 1.7–7.7)
Neutrophils Relative %: 57 %
Platelets: 282 10*3/uL (ref 150–400)
RBC: 5.04 MIL/uL (ref 3.87–5.11)
RDW: 12.6 % (ref 11.5–15.5)
WBC: 7.6 10*3/uL (ref 4.0–10.5)
nRBC: 0 % (ref 0.0–0.2)

## 2020-05-30 LAB — BASIC METABOLIC PANEL
Anion gap: 11 (ref 5–15)
BUN: 14 mg/dL (ref 6–20)
CO2: 28 mmol/L (ref 22–32)
Calcium: 9.5 mg/dL (ref 8.9–10.3)
Chloride: 102 mmol/L (ref 98–111)
Creatinine, Ser: 0.91 mg/dL (ref 0.44–1.00)
GFR calc Af Amer: >60 mL/min (ref >60)
GFR calc non Af Amer: >60 mL/min (ref >60)
Glucose, Bld: 108 mg/dL — ABNORMAL HIGH (ref 70–99)
Potassium: 4.4 mmol/L (ref 3.5–5.1)
Sodium: 141 mmol/L (ref 135–145)

## 2020-05-30 LAB — GROUP A STREP BY PCR: Group A Strep by PCR: NOT DETECTED

## 2020-05-30 LAB — MONONUCLEOSIS SCREEN: Mono Screen: NEGATIVE

## 2020-05-30 MED ORDER — LORAZEPAM 2 MG/ML IJ SOLN
1.0000 mg | Freq: Once | INTRAMUSCULAR | Status: AC
  Administered 2020-05-30: 1 mg via INTRAVENOUS
  Filled 2020-05-30: qty 1

## 2020-05-30 MED ORDER — IOHEXOL 300 MG/ML  SOLN
75.0000 mL | Freq: Once | INTRAMUSCULAR | Status: AC | PRN
  Administered 2020-05-30: 75 mL via INTRAVENOUS

## 2020-05-30 MED ORDER — SODIUM CHLORIDE 0.9 % IV BOLUS
1000.0000 mL | Freq: Once | INTRAVENOUS | Status: AC
  Administered 2020-05-30: 1000 mL via INTRAVENOUS

## 2020-05-30 NOTE — ED Triage Notes (Signed)
Pt reports she had a panic attack while driving 1 hour prior to arrival to ED. Pt took took an ativan at 12pm. Pt is concerned about having to move, looking for a job, and waiting on CT results for her abdominal pain. Pt states her neck began swelling when she had anxiety and is concerned about that.

## 2020-05-30 NOTE — ED Provider Notes (Signed)
[facility] DEPT Provider Note   CSN: [id number] Arrival date & time: 05/30/20  1321     History Chief Complaint  Patient presents with   Anxiety   neck swelling    Donna Hancock is a 54 y.o. female.  Pt presents to the ED today with anxiety and neck swelling.  Pt has a hx of anxiety and felt she had a panic attack today.  She said it was a little worse than normal.  She has also noticed some swelling to her neck.  She denies any sore throat.  No fevers/chills.  She had Covid in October and feels like she's not been normal since then.        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     Family History  Problem Relation Age of Onset   Brain cancer Mother        Tumor on brainstem- not genetic   Colon cancer Maternal Aunt    Pancreatic cancer Neg Hx    Stomach cancer Neg Hx     Social History   Tobacco Use   Smoking status: Never Smoker   Smokeless tobacco: Never Used  Substance Use Topics   Alcohol use: Yes    Alcohol/week: 0.0 standard drinks    Comment: rare wine   Drug use: No    Home Medications Prior to Admission medications   Medication Sig Start Date End Date Taking? Authorizing  Provider  fluticasone (FLONASE) 50 MCG/ACT nasal spray Place 2 sprays into both nostrils daily. Patient taking differently: Place 2 sprays into both nostrils daily as needed for allergies.  05/19/18  Yes Marin Olp, MD  LORazepam (ATIVAN) 0.5 MG tablet Take 1 tablet (0.5 mg total) by mouth 2 (two) times daily as needed. for anxiety Patient taking differently: Take 0.25-0.5 mg by mouth 2 (two) times daily as needed for anxiety. for anxiety 08/04/19  Yes Marin Olp, MD  Multiple Vitamins-Minerals (MULTIVITAMIN ADULTS PO) Take 1 tablet by mouth daily.   Yes [provider]  SUMAtriptan (IMITREX) 100 MG tablet Take 1 tablet (100 mg total) by mouth every 2 (two) hours as needed. Twice a day maximum Patient taking differently: Take 100 mg by mouth every 2 (two) hours as needed for migraine.  08/04/19  Yes Marin Olp, MD  guaiFENesin-codeine 100-10 MG/5ML syrup Take 5 mLs by mouth every 6 (six) hours as needed for cough. Patient not taking: Reported on 05/30/2020 03/17/19   Marin Olp, MD  omeprazole (PRILOSEC) 20 MG capsule Take 1 capsule (20 mg total) by mouth daily. Patient not taking: Reported on 05/30/2020 02/28/19   Marin Olp, MD  Allergies    Citalopram hydrobromide, Lexapro [escitalopram oxalate], and Wellbutrin [bupropion hcl]  Review of Systems   Review of Systems  HENT:       Neck swelling  Psychiatric/Behavioral: The patient is nervous/anxious.   All other systems reviewed and are negative.   Physical Exam Updated Vital Signs BP 105/67    Pulse 73    Temp 98.3 F (36.8 C) (Oral)    Resp 17    SpO2 96%   Physical Exam Vitals and nursing note reviewed.  Constitutional:      Appearance: Normal appearance.  HENT:     Head: Normocephalic and atraumatic.     Right Ear: External ear normal.     Left Ear: External ear normal.     Nose: Nose normal.     Mouth/Throat:     Mouth: Mucous membranes are moist.     Pharynx: Oropharynx is clear.    Eyes:     Extraocular Movements: Extraocular movements intact.     Conjunctiva/sclera: Conjunctivae normal.     Pupils: Pupils are equal, round, and reactive to light.  Cardiovascular:     Rate and Rhythm: Normal rate and regular rhythm.     Pulses: Normal pulses.     Heart sounds: Normal heart sounds.  Pulmonary:     Effort: Pulmonary effort is normal.     Breath sounds: Normal breath sounds.  Abdominal:     General: Abdomen is flat. Bowel sounds are normal.     Palpations: Abdomen is soft.  Musculoskeletal:        General: Normal range of motion.  Lymphadenopathy:     Cervical: Cervical adenopathy present.     Right cervical: Superficial cervical adenopathy present.     Left cervical: Superficial cervical adenopathy present.  Skin:    General: Skin is warm.     Capillary Refill: Capillary refill takes less than 2 seconds.  Neurological:     General: No focal deficit present.     Mental Status: She is alert and oriented to person, place, and time.  Psychiatric:        Mood and Affect: Mood is anxious.     ED Results / Procedures / Treatments   Labs (all labs ordered are listed, but only abnormal results are displayed) Labs Reviewed  BASIC METABOLIC PANEL - Abnormal; Notable for the following components:      Result Value   Glucose, Bld 108 (*)    All other components within normal limits  GROUP A STREP BY PCR  CBC WITH DIFFERENTIAL/PLATELET  MONONUCLEOSIS SCREEN    EKG [facility]  Radiology No results found.   IMPRESSION: 1. Negative CT of the neck. No acute inflammatory changes or other abnormality identified. No adenopathy. 2. 2 cm left thyroid versus parathyroid nodule as above. Further evaluation with dedicated thyroid ultrasound recommended for further evaluation. This could be performed on a nonemergent outpatient basis. (Ref: J Am Coll Radiol. 2015 Feb;12(2): 143-50).   Procedures Procedures (including critical care time)  Medications Ordered in  ED Medications  sodium chloride 0.9 % bolus 1,000 mL (0 mLs Intravenous Stopped 05/31/20 0004)  LORazepam (ATIVAN) injection 1 mg (1 mg Intravenous Given 05/30/20 2139)  iohexol (OMNIPAQUE) 300 MG/ML solution 75 mL (75 mLs Intravenous Contrast Given 05/30/20 2238)    ED Course  I have reviewed the triage vital signs and the nursing notes.  Pertinent labs & imaging results that were available during my care of the patient were reviewed by me and  considered in my medical decision making (see chart for details).    MDM Rules/Calculators/A&P                      Pt feels better after IV ativan.  CT neck without any acute abnormalities other than a thyroid nodule.  Outpatient thyroid US ordered.  Pt knows to return if worse.  F/u with pcp.  Final Clinical Impression(s) / ED Diagnoses Final diagnoses:  Panic attack  Anterior cervical adenopathy  Thyroid nodule    Rx / DC Orders ED Discharge Orders         Ordered    US THYROID     05/31/20 0011           Isla Pence, MD 05/31/20 0013

## 2020-05-31 NOTE — Discharge Instructions (Signed)
You will need a thyroid ultrasound as an outpatient to evaluate your thyroid nodule.

## 2021-09-21 ENCOUNTER — Other Ambulatory Visit: Payer: Self-pay

## 2021-09-21 ENCOUNTER — Encounter (HOSPITAL_BASED_OUTPATIENT_CLINIC_OR_DEPARTMENT_OTHER): Payer: Self-pay | Admitting: Emergency Medicine

## 2021-09-21 ENCOUNTER — Emergency Department (HOSPITAL_BASED_OUTPATIENT_CLINIC_OR_DEPARTMENT_OTHER)
Admission: EM | Admit: 2021-09-21 | Discharge: 2021-09-21 | Disposition: A | Discharge status: Home / Self Care | Payer: Commercial Managed Care - PPO | Attending: Emergency Medicine | Admitting: Emergency Medicine

## 2021-09-21 ENCOUNTER — Emergency Department (HOSPITAL_BASED_OUTPATIENT_CLINIC_OR_DEPARTMENT_OTHER): Payer: Commercial Managed Care - PPO

## 2021-09-21 DIAGNOSIS — M7122 Synovial cyst of popliteal space [Baker], left knee: Secondary | ICD-10-CM | POA: Insufficient documentation

## 2021-09-21 DIAGNOSIS — M25562 Pain in left knee: Secondary | ICD-10-CM | POA: Diagnosis present

## 2021-09-21 DIAGNOSIS — R600 Localized edema: Secondary | ICD-10-CM | POA: Insufficient documentation

## 2021-09-21 NOTE — ED Provider Notes (Signed)
[facility] EMERGENCY DEPARTMENT Provider Note   CSN: [id number] Arrival date & time: 09/21/21  1342     History Chief Complaint  Patient presents with  . Leg Pain    Donna Hancock is a 55 y.o. female here presenting with left knee pain.  Patient noticed swelling behind the left knee about 4 days ago.  Denies any chest pain shortness of breath.  Denies any trauma or injury.  Went to urgent care was sent in for rule out DVT  The history is provided by the patient.      Past Medical History:  Diagnosis Date  . ANEMIA-IRON DEFICIENCY 12/21/2008   Likely related to menstruation- now resolved after ablation    . Anxiety   . Panic attack   . Tubular adenoma of colon    11/11/13 with 5 year repeat    Patient Active Problem List   Diagnosis Date Noted  . Hyperlipidemia, unspecified 05/19/2018  . 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 Tubular adenoma of colon   . IBS (irritable bowel syndrome) 09/28/2013  . GERD (gastroesophageal reflux disease) 09/28/2013  . Panic attacks 09/07/2007  . Allergic rhinitis 09/07/2007  . Migraine headache 10/19/2006    Past Surgical History:  Procedure Laterality Date  . ABLATION     uterine  . CERVICAL POLYPECTOMY  09/29/2012  . CHOLECYSTECTOMY       OB History   No obstetric history on file.     Family History  Problem Relation Age of Onset  . Brain cancer Mother        Tumor on brainstem- not genetic  . Colon cancer Maternal Aunt   . Pancreatic cancer Neg Hx   . Stomach cancer Neg Hx     Social History   Tobacco Use  . Smoking status: Never  . Smokeless tobacco: Never  Vaping Use  . Vaping Use: Never used  Substance Use Topics  . Alcohol use: Yes    Alcohol/week: 0.0 standard drinks    Comment: rare wine  . Drug use: No    Home Medications Prior to Admission medications   Medication Sig Start Date End Date Taking? Authorizing Provider  fluticasone (FLONASE) 50 MCG/ACT nasal spray Place 2 sprays into  both nostrils daily. Patient taking differently: Place 2 sprays into both nostrils daily as needed for allergies.  05/19/18   Marin Olp, MD  guaiFENesin-codeine 100-10 MG/5ML syrup Take 5 mLs by mouth every 6 (six) hours as needed for cough. Patient not taking: Reported on 05/30/2020 03/17/19   Marin Olp, MD  LORazepam (ATIVAN) 0.5 MG tablet Take 1 tablet (0.5 mg total) by mouth 2 (two) times daily as needed. for anxiety Patient taking differently: Take 0.25-0.5 mg by mouth 2 (two) times daily as needed for anxiety. for anxiety 08/04/19   Marin Olp, MD  Multiple Vitamins-Minerals (MULTIVITAMIN ADULTS PO) Take 1 tablet by mouth daily.    [provider]  omeprazole (PRILOSEC) 20 MG capsule Take 1 capsule (20 mg total) by mouth daily. Patient not taking: Reported on 05/30/2020 02/28/19   Marin Olp, MD  SUMAtriptan (IMITREX) 100 MG tablet Take 1 tablet (100 mg total) by mouth every 2 (two) hours as needed. Twice a day maximum Patient taking differently: Take 100 mg by mouth every 2 (two) hours as needed for migraine.  08/04/19   Marin Olp, MD    Allergies    Citalopram hydrobromide, Lexapro [escitalopram oxalate], and Wellbutrin [bupropion hcl]  Review of  Systems   Review of Systems  Musculoskeletal:        L knee pain   All other systems reviewed and are negative.  Physical Exam Updated Vital Signs BP (!) 147/93 (BP Location: Left Arm)   Pulse 76   Temp 98.6 F (37 C) (Oral)   Resp 16   Ht 5\' 7"  (1.702 m)   Wt 81.6 kg   SpO2 98%   BMI 28.19 kg/m   Physical Exam Vitals and nursing note reviewed.  Constitutional:      Appearance: Normal appearance.  HENT:     Head: Normocephalic.     Nose: Nose normal.     Mouth/Throat:     Mouth: Mucous membranes are moist.  Eyes:     Pupils: Pupils are equal, round, and reactive to light.  Cardiovascular:     Rate and Rhythm: Normal rate.  Pulmonary:     Effort: Pulmonary effort is normal.   Abdominal:     General: Abdomen is flat.  Musculoskeletal:     Cervical back: Normal range of motion.     Comments: Nl ROM L knee. Swelling behind L knee. No obvious calf or thigh swelling. 2+ DP pulse   Neurological:     General: No focal deficit present.     Mental Status: She is alert and oriented to person, place, and time.  Psychiatric:        Mood and Affect: Mood normal.        Behavior: Behavior normal.    ED Results / Procedures / Treatments   Labs (all labs ordered are listed, but only abnormal results are displayed) Labs Reviewed - No data to display  EKG None  Radiology US Venous Img Lower Unilateral Left  Result Date: 09/21/2021 CLINICAL DATA:  Left knee pain for the past 4 days. Evaluate for DVT. EXAM: LEFT LOWER EXTREMITY VENOUS DOPPLER ULTRASOUND TECHNIQUE: Gray-scale sonography with graded compression, as well as color Doppler and duplex ultrasound were performed to evaluate the lower extremity deep venous systems from the level of the common femoral vein and including the common femoral, femoral, profunda femoral, popliteal and calf veins including the posterior tibial, peroneal and gastrocnemius veins when visible. The superficial great saphenous vein was also interrogated. Spectral Doppler was utilized to evaluate flow at rest and with distal augmentation maneuvers in the common femoral, femoral and popliteal veins. COMPARISON:  None. FINDINGS: Contralateral Common Femoral Vein: Respiratory phasicity is normal and symmetric with the symptomatic side. No evidence of thrombus. Normal compressibility. Common Femoral Vein: No evidence of thrombus. Normal compressibility, respiratory phasicity and response to augmentation. Saphenofemoral Junction: No evidence of thrombus. Normal compressibility and flow on color Doppler imaging. Profunda Femoral Vein: No evidence of thrombus. Normal compressibility and flow on color Doppler imaging. Femoral Vein: No evidence of thrombus.  Normal compressibility, respiratory phasicity and response to augmentation. Popliteal Vein: No evidence of thrombus. Normal compressibility, respiratory phasicity and response to augmentation. Calf Veins: No evidence of thrombus. Normal compressibility and flow on color Doppler imaging. Superficial Great Saphenous Vein: No evidence of thrombus. Normal compressibility. Venous Reflux:  None. Other Findings: Note is made of an approximately 4.9 x 3.0 x 1.3 cm serpiginous fluid collection with left popliteal fossa compatible with a Baker's cyst. IMPRESSION: 1. No evidence of DVT within the left lower extremity. 2. Note made of an approximately 4.9 cm left-sided Baker's cyst. Electronically Signed   By: Sandi Mariscal M.D.   On: 09/21/2021 15:18    Procedures  Procedures   Medications Ordered in ED Medications - No data to display  ED Course  I have reviewed the triage vital signs and the nursing notes.  Pertinent labs & imaging results that were available during my care of the patient were reviewed by me and considered in my medical decision making (see chart for details).    MDM Rules/Calculators/A&P                           Donna Hancock is a 55 y.o. female here with swelling behind L knee. No DVT on [place], just baker's cyst. Given knee sleeve. Told her to keep leg elevated. Will refer to ortho for aspiration if it gets too large   Final Clinical Impression(s) / ED Diagnoses Final diagnoses:  None    Rx / DC Orders ED Discharge Orders     None        Drenda Freeze, MD 09/21/21 [phone]

## 2021-09-21 NOTE — Discharge Instructions (Addendum)
Keep leg elevated and use knee sleeve.   Follow up with orthopedic doctor. You may need aspiration of the cyst if it continues to get larger   Return to ER if you have worse knee pain or swelling

## 2021-09-21 NOTE — ED Triage Notes (Signed)
Pain behind left knee x 4 days.  Sent here from urgent care for [place] to r/o blood clot.

## 2021-11-28 ENCOUNTER — Other Ambulatory Visit: Payer: Self-pay | Admitting: Otolaryngology

## 2021-11-28 DIAGNOSIS — E041 Nontoxic single thyroid nodule: Secondary | ICD-10-CM

## 2021-12-10 ENCOUNTER — Inpatient Hospital Stay: Admission: RE | Admit: 2021-12-10 | Payer: Commercial Managed Care - PPO | Source: Ambulatory Visit

## 2021-12-11 ENCOUNTER — Ambulatory Visit
Admission: RE | Admit: 2021-12-11 | Discharge: 2021-12-11 | Disposition: A | Discharge status: Home / Self Care | Payer: Commercial Managed Care - PPO | Source: Ambulatory Visit | Attending: Otolaryngology | Admitting: Otolaryngology

## 2021-12-11 ENCOUNTER — Other Ambulatory Visit: Payer: Commercial Managed Care - PPO

## 2021-12-11 ENCOUNTER — Other Ambulatory Visit (HOSPITAL_COMMUNITY)
Admission: RE | Admit: 2021-12-11 | Discharge: 2021-12-11 | Disposition: A | Discharge status: Home / Self Care | Payer: Commercial Managed Care - PPO | Source: Ambulatory Visit | Attending: Otolaryngology | Admitting: Otolaryngology

## 2021-12-11 DIAGNOSIS — E041 Nontoxic single thyroid nodule: Secondary | ICD-10-CM

## 2021-12-12 LAB — CYTOLOGY - NON PAP

## 2021-12-17 ENCOUNTER — Other Ambulatory Visit: Payer: Commercial Managed Care - PPO

## 2021-12-26 ENCOUNTER — Encounter (HOSPITAL_COMMUNITY): Payer: Self-pay

## 2022-01-07 ENCOUNTER — Other Ambulatory Visit: Payer: Commercial Managed Care - PPO

## 2022-02-18 ENCOUNTER — Other Ambulatory Visit (HOSPITAL_COMMUNITY): Payer: Commercial Managed Care - PPO

## 2022-02-21 ENCOUNTER — Ambulatory Visit: Admit: 2022-02-21 | Payer: Commercial Managed Care - PPO | Admitting: Otolaryngology

## 2022-02-21 SURGERY — THYROIDECTOMY
Anesthesia: General | Laterality: Left

## 2022-03-07 NOTE — H&P (Signed)
HPI:  ? ?Donna Hancock is a 56 y.o. female who presents as a consult Patient.  ? ?Referring Provider: Rudene Anda, MD ? ?Chief complaint: Thyroid nodules. ? ?HPI: Here for second opinion. She was found to have thyroid nodules on the left several years ago. Repeat ultrasound this year revealed changes and 2 of the nodules were considered suspicious. Recommendation was made for needle aspirate. Otherwise in good health. She is a regular Q-tip user. ? ?PMH/Meds/All/SocHx/FamHx/ROS:  ? ?Past Medical History:  ?Diagnosis Date  ? Actinic keratosis  ? Anxiety  ? Colon polyp  ? History of adenomatous polyp of colon  ? IBS (irritable bowel syndrome)  ? Systemic lupus erythematosus ([place])  ? ?Past Surgical History:  ?Procedure Laterality Date  ? COLONOSCOPY 2014, 2019  ? COLONOSCOPY W/ BIOPSIES AND POLYPECTOMY  ? ENDOMETRIAL ABLATION  ? GALLBLADDER SURGERY  ? HEMORRHOID BANDING 2012, 2019  ? SKIN BIOPSY  ? ?No family history of bleeding disorders, wound healing problems or difficulty with anesthesia.  ? ?Social History  ? ?Socioeconomic History  ? Marital status: Single  ?Spouse name: Not on file  ? Number of children: Not on file  ? Years of education: Not on file  ? Highest education level: Not on file  ?Occupational History  ? Not on file  ?Tobacco Use  ? Smoking status: Never Smoker  ? Smokeless tobacco: Never Used  ?Substance and Sexual Activity  ? Alcohol use: No  ? Drug use: No  ? Sexual activity: Not on file  ?Other Topics Concern  ? Not on file  ?Social History Narrative  ? Not on file  ? ?Social Determinants of Health  ? ?Financial Resource Strain: Not on file  ?Food Insecurity: Not on file  ?Transportation Needs: Not on file  ?Physical Activity: Not on file  ?Stress: Not on file  ?Social Connections: Not on file  ?Housing Stability: Not on file  ? ?Current Outpatient Medications:  ? aspirin/acetaminophen/caffeine (EXCEDRIN MIGRAINE ORAL), Take by mouth., Disp: , Rfl:  ? benzonatate (TESSALON PERLES) 200 MG capsule, Take 1  capsule (200 mg total) by mouth 3 (three) times daily as needed for Cough., Disp: 30 capsule, Rfl: 1 ? clobetasoL (TEMOVATE) 0.05 % ointment, Apply to AFFECTED areas of hand twice daily ONLY on Saturday and Sunday. NEVER to face/groin/armpits., Disp: 60 g, Rfl: 2 ? fluticasone propionate (FLONASE) 50 mcg/actuation nasal spray, 2 sprays by Each Nare route daily., Disp: 1 Inhaler, Rfl: 1 ? hydrOXYchloroQUINE (PLAQUENIL) 200 mg tablet, Take 1 tablet (200 mg total) by mouth 2 times daily., Disp: 60 tablet, Rfl: 1 ? LORazepam (ATIVAN) 0.5 MG tablet, TAKE 1 TABLET BY MOUTH TWICE DAILY AS NEEDED FOR ANXIETY FOR UP TO 30 DAYS, Disp: 60 tablet, Rfl: 2 ? ipratropium (ATROVENT) 42 mcg (0.06 %) nasal spray, 2 sprays by Nasal route 3 times daily for 21 days., Disp: 15 mL, Rfl: 0 ? ?A complete ROS was performed with pertinent positives/negatives noted in the HPI. The remainder of the ROS are negative. ? ? ?Physical Exam:  ? ?BP 141/93  Pulse 76  Temp 97.1 ?F (36.2 ?C)  Ht 1.702 m ('5\' 7"'$ )  Wt 83.9 kg (185 lb)  BMI 28.98 kg/m?  ? ?General: Healthy and alert, in no distress, breathing easily. Normal affect. In a pleasant mood. ?Head: Normocephalic, atraumatic. No masses, or scars. ?Eyes: Pupils are equal, and reactive to light. Vision is grossly intact. No spontaneous or gaze nystagmus. ?Ears: Ear canals are clear on the left with impacted cerumen  on the right that was cleaned out under the microscope. Tympanic membranes are intact, with normal landmarks and the middle ears are clear and healthy. ?Hearing: Grossly normal. ?Nose: Nasal cavities are clear with healthy mucosa, no polyps or exudate. Airways are patent. ?Face: No masses or scars, facial nerve function is symmetric. ?Oral Cavity: No mucosal abnormalities are noted. Tongue with normal mobility. Dentition appears healthy. ?Oropharynx: Tonsils are symmetric. There are no mucosal masses identified. Tongue base appears normal and healthy. ?Larynx/Hypopharynx: indirect  exam reveals healthy, mobile vocal cords, without mucosal lesions in the hypopharynx or larynx. ?Chest: Deferred ?Neck: No palpable masses, no cervical adenopathy, no thyroid nodules or enlargement. ?Neuro: Cranial nerves II-XII with normal function. ?Balance: Normal gate. ?Other findings: none. ? ?Independent Review of Additional Tests or Records:  ?none ? ?Procedures:  ?Procedure note: ? ?Indications: Cerumen impaction ? ?Details of cerumen removal were discussed with the patient and all questions were answered. ? ?Procedure: ? ?Using the operating microscope, the right side was cleaned of cerumen using otologic forceps. There was no signs of infection. Symptoms were releaved. ? ?She tolerated this procedure well. ?There were no complications. ? ?Impression & Plans:  ?Left side thyroid nodules, 2, suspicious on ultrasound. We will schedule for ultrasound-guided needle aspirate and radiology. ? ?Right side cerumen impaction, cleaned out. Recommend she stop using Q-tips.  ? ?

## 2022-03-19 NOTE — Progress Notes (Signed)
Surgical Instructions ? ? ? Your procedure is scheduled on 03/26/22. ? Report to [facility] Main Entrance "A" at 5:30 A.M., then check in with the Admitting office. ? Call this number if you have problems the morning of surgery: ? [phone] ? ? If you have any questions prior to your surgery date call [phone]: Open Monday-Friday 8am-4pm ? ? ? Remember: ? Do not eat or drink after midnight the night before your surgery ? ? ?  ? Take these medicines the morning of surgery with A SIP OF WATER:  ? ?AS NEEDED: ?acetaminophen (TYLENOL)  ?cetirizine (ZYRTEC)  ?fluticasone (FLONASE)  ?LORazepam (ATIVAN) ? ? ?As of today, STOP taking any Aspirin (unless otherwise instructed by your surgeon) Aleve, Naproxen, Ibuprofen, Motrin, Advil, Goody's, BC's, all herbal medications, fish oil, and all vitamins. ? ?         ?Do not wear jewelry or makeup ?Do not wear lotions, powders, perfumes or deodorant. ?Do not shave 48 hours prior to surgery.  ?Do not bring valuables to the hospital. ?Do not wear nail polish, gel polish, artificial nails, or any other type of covering on natural nails (fingers and toes) ?If you have artificial nails or gel coating that need to be removed by a nail salon, please have this removed prior to surgery. Artificial nails or gel coating may interfere with anesthesia's ability to adequately monitor your vital signs. ? ?[facility] is not responsible for any belongings or valuables. .  ? ?Do NOT Smoke (Tobacco/Vaping)  24 hours prior to your procedure ? ?If you use a CPAP at night, you may bring your mask for your overnight stay. ?  ?Contacts, glasses, hearing aids, dentures or partials may not be worn into surgery, please bring cases for these belongings ?  ?For patients admitted to the hospital, discharge time will be determined by your treatment team. ?  ?Patients discharged the day of surgery will not be allowed to drive home, and someone needs to stay with them for 24 hours. ? ? ?SURGICAL WAITING  ROOM VISITATION ?Patients having surgery or a procedure in a hospital may have two support people. ?Children under the age of 51 must have an adult with them who is not the patient. ?They may stay in the waiting area during the procedure and may switch out with other visitors. If the patient needs to stay at the hospital during part of their recovery, the visitor guidelines for inpatient rooms apply. ? ?Please refer to the [facility] website for the visitor guidelines for Inpatients (after your surgery is over and you are in a regular room).  ? ? ? ? ? ?Special instructions:   ? ?Oral Hygiene is also important to reduce your risk of infection.  Remember - BRUSH YOUR TEETH THE MORNING OF SURGERY WITH YOUR REGULAR TOOTHPASTE ? ? ?[facility]- Preparing For Surgery ? ?Before surgery, you can play an important role. Because skin is not sterile, your skin needs to be as free of germs as possible. You can reduce the number of germs on your skin by washing with CHG (chlorahexidine gluconate) Soap before surgery.  CHG is an antiseptic cleaner which kills germs and bonds with the skin to continue killing germs even after washing.   ? ? ?Please do not use if you have an allergy to CHG or antibacterial soaps. If your skin becomes reddened/irritated stop using the CHG.  ?Do not shave (including legs and underarms) for at least 48 hours prior to first CHG shower. It  is OK to shave your face. ? ?Please follow these instructions carefully. ?  ? ? Shower the NIGHT BEFORE SURGERY and the MORNING OF SURGERY with CHG Soap.  ? If you chose to wash your hair, wash your hair first as usual with your normal shampoo. After you shampoo, rinse your hair and body thoroughly to remove the shampoo.  Then [employer] and genitals (private parts) with your normal soap and rinse thoroughly to remove soap. ? ?After that Use CHG Soap as you would any other liquid soap. You can apply CHG directly to the skin and wash gently with a scrungie or a clean  washcloth.  ? ?Apply the CHG Soap to your body ONLY FROM THE NECK DOWN.  Do not use on open wounds or open sores. Avoid contact with your eyes, ears, mouth and genitals (private parts). Wash Face and genitals (private parts)  with your normal soap.  ? ?Wash thoroughly, paying special attention to the area where your surgery will be performed. ? ?Thoroughly rinse your body with warm water from the neck down. ? ?DO NOT shower/wash with your normal soap after using and rinsing off the CHG Soap. ? ?Pat yourself dry with a CLEAN TOWEL. ? ?Wear CLEAN PAJAMAS to bed the night before surgery ? ?Place CLEAN SHEETS on your bed the night before your surgery ? ?DO NOT SLEEP WITH PETS. ? ? ?Day of Surgery: ? ?Take a shower with CHG soap. ?Wear Clean/Comfortable clothing the morning of surgery ?Do not apply any deodorants/lotions.   ?Remember to brush your teeth WITH YOUR REGULAR TOOTHPASTE. ? ? ? ?If you received a COVID test during your pre-op visit  it is requested that you wear a mask when out in public, stay away from anyone that may not be feeling well and notify your surgeon if you develop symptoms. If you have been in contact with anyone that has tested positive in the last 10 days please notify you surgeon. ? ?  ?Please read over the following fact sheets that you were given.   ?

## 2022-03-20 ENCOUNTER — Other Ambulatory Visit: Payer: Self-pay

## 2022-03-20 ENCOUNTER — Encounter (HOSPITAL_COMMUNITY)
Admission: RE | Admit: 2022-03-20 | Discharge: 2022-03-20 | Disposition: A | Discharge status: Home / Self Care | Payer: 59 | Source: Ambulatory Visit | Attending: Otolaryngology | Admitting: Otolaryngology

## 2022-03-20 ENCOUNTER — Encounter (HOSPITAL_COMMUNITY): Payer: Self-pay

## 2022-03-20 VITALS — BP 147/95 | HR 85 | Temp 98.0°F | Resp 18 | Ht 67.0 in | Wt 189.2 lb

## 2022-03-20 DIAGNOSIS — Z01818 Encounter for other preprocedural examination: Secondary | ICD-10-CM

## 2022-03-20 DIAGNOSIS — Z01812 Encounter for preprocedural laboratory examination: Secondary | ICD-10-CM | POA: Insufficient documentation

## 2022-03-20 HISTORY — DX: Nausea with vomiting, unspecified: R11.2

## 2022-03-20 HISTORY — DX: Irritable bowel syndrome, unspecified: K58.9

## 2022-03-20 HISTORY — DX: Other specified postprocedural states: Z98.890

## 2022-03-20 HISTORY — DX: Pneumonia, unspecified organism: J18.9

## 2022-03-20 LAB — CBC
HCT: 43.9 % (ref 36.0–46.0)
Hemoglobin: 14.5 g/dL (ref 12.0–15.0)
MCH: 28.8 pg (ref 26.0–34.0)
MCHC: 33 g/dL (ref 30.0–36.0)
MCV: 87.3 fL (ref 80.0–100.0)
Platelets: 259 10*3/uL (ref 150–400)
RBC: 5.03 MIL/uL (ref 3.87–5.11)
RDW: 13 % (ref 11.5–15.5)
WBC: 6.1 10*3/uL (ref 4.0–10.5)
nRBC: 0 % (ref 0.0–0.2)

## 2022-03-20 MED ORDER — CHLORHEXIDINE GLUCONATE 0.12 % MT SOLN: 15.0000 mL | Freq: Once | OROMUCOSAL | Status: DC

## 2022-03-20 MED ORDER — LACTATED RINGERS IV SOLN: INTRAVENOUS | Status: DC

## 2022-03-20 MED ORDER — ORAL CARE MOUTH RINSE: 15.0000 mL | Freq: Once | OROMUCOSAL | Status: DC

## 2022-03-20 NOTE — Progress Notes (Signed)
PCP - Dr. Mina Marble ?Cardiologist - patient denies ? ?PPM/ICD - n/a ?Device Orders -  ?Rep Notified -  ? ?Chest x-ray - n/a ?EKG - n/a ?Stress Test - patient denies ?ECHO - patient denies ?Cardiac Cath - patient denies ? ?Sleep Study - patient denies ?CPAP -  ? ?Fasting Blood Sugar - n/a ?Checks Blood Sugar _____ times a day ? ?Blood Thinner Instructions: n/a ?Aspirin Instructions: n/a ? ?ERAS Protcol - NPO p midnight ?PRE-SURGERY Ensure or G2-  ? ?COVID TEST- n/a ? ? ?Anesthesia review: n/a ? ?Patient denies shortness of breath, fever, cough and chest pain at PAT appointment ? ? ?All instructions explained to the patient, with a verbal understanding of the material. Patient agrees to go over the instructions while at home for a better understanding. Patient also instructed to self quarantine after being tested for COVID-19. The opportunity to ask questions was provided. ? ? ?

## 2022-03-26 ENCOUNTER — Ambulatory Visit (HOSPITAL_BASED_OUTPATIENT_CLINIC_OR_DEPARTMENT_OTHER): Payer: 59 | Admitting: Anesthesiology

## 2022-03-26 ENCOUNTER — Observation Stay (HOSPITAL_COMMUNITY)
Admission: RE | Admit: 2022-03-26 | Discharge: 2022-03-27 | Disposition: A | Discharge status: Home / Self Care | Payer: 59 | Attending: Otolaryngology | Admitting: Otolaryngology

## 2022-03-26 ENCOUNTER — Ambulatory Visit (HOSPITAL_COMMUNITY): Payer: 59 | Admitting: Anesthesiology

## 2022-03-26 ENCOUNTER — Encounter (HOSPITAL_COMMUNITY)
Admission: RE | Disposition: A | Discharge status: Home / Self Care | Payer: Self-pay | Source: Home / Self Care | Attending: Otolaryngology

## 2022-03-26 ENCOUNTER — Encounter (HOSPITAL_COMMUNITY): Payer: Self-pay | Admitting: Otolaryngology

## 2022-03-26 ENCOUNTER — Other Ambulatory Visit: Payer: Self-pay

## 2022-03-26 DIAGNOSIS — D34 Benign neoplasm of thyroid gland: Secondary | ICD-10-CM | POA: Insufficient documentation

## 2022-03-26 DIAGNOSIS — Z7982 Long term (current) use of aspirin: Secondary | ICD-10-CM | POA: Diagnosis not present

## 2022-03-26 DIAGNOSIS — C73 Malignant neoplasm of thyroid gland: Secondary | ICD-10-CM | POA: Diagnosis not present

## 2022-03-26 DIAGNOSIS — E041 Nontoxic single thyroid nodule: Secondary | ICD-10-CM | POA: Diagnosis present

## 2022-03-26 HISTORY — PX: THYROIDECTOMY: SHX17

## 2022-03-26 LAB — POCT PREGNANCY, URINE: Preg Test, Ur: NEGATIVE

## 2022-03-26 SURGERY — THYROIDECTOMY
Anesthesia: General | Site: Neck | Laterality: Left

## 2022-03-26 MED ORDER — PROPOFOL 10 MG/ML IV BOLUS
INTRAVENOUS | Status: AC
  Filled 2022-03-26: qty 20

## 2022-03-26 MED ORDER — LORAZEPAM 0.5 MG PO TABS
0.5000 mg | ORAL_TABLET | Freq: Two times a day (BID) | ORAL | Status: DC | PRN
  Administered 2022-03-26: 0.5 mg via ORAL
  Filled 2022-03-26 (×2): qty 1

## 2022-03-26 MED ORDER — ONDANSETRON HCL 4 MG PO TABS: 4.0000 mg | ORAL_TABLET | ORAL | Status: DC | PRN

## 2022-03-26 MED ORDER — FLUTICASONE PROPIONATE 50 MCG/ACT NA SUSP
2.0000 | Freq: Every day | NASAL | Status: DC | PRN
  Filled 2022-03-26: qty 16

## 2022-03-26 MED ORDER — PHENYLEPHRINE 40 MCG/ML (10ML) SYRINGE FOR IV PUSH (FOR BLOOD PRESSURE SUPPORT)
PREFILLED_SYRINGE | INTRAVENOUS | Status: DC | PRN
  Administered 2022-03-26: 120 ug via INTRAVENOUS

## 2022-03-26 MED ORDER — OXYCODONE HCL 5 MG PO TABS
5.0000 mg | ORAL_TABLET | Freq: Once | ORAL | Status: AC | PRN
  Administered 2022-03-26: 5 mg via ORAL

## 2022-03-26 MED ORDER — LIDOCAINE 2% (20 MG/ML) 5 ML SYRINGE
INTRAMUSCULAR | Status: DC | PRN
  Administered 2022-03-26: 40 mg via INTRAVENOUS

## 2022-03-26 MED ORDER — FENTANYL CITRATE (PF) 250 MCG/5ML IJ SOLN
INTRAMUSCULAR | Status: DC | PRN
  Administered 2022-03-26 (×2): 50 ug via INTRAVENOUS
  Administered 2022-03-26: 100 ug via INTRAVENOUS

## 2022-03-26 MED ORDER — FENTANYL CITRATE (PF) 100 MCG/2ML IJ SOLN
INTRAMUSCULAR | Status: AC
  Filled 2022-03-26: qty 2

## 2022-03-26 MED ORDER — BACITRACIN ZINC 500 UNIT/GM EX OINT
TOPICAL_OINTMENT | CUTANEOUS | Status: AC
  Filled 2022-03-26: qty 28.35

## 2022-03-26 MED ORDER — MIDAZOLAM HCL 2 MG/2ML IJ SOLN
INTRAMUSCULAR | Status: AC
  Filled 2022-03-26: qty 2

## 2022-03-26 MED ORDER — DEXAMETHASONE SODIUM PHOSPHATE 10 MG/ML IJ SOLN
INTRAMUSCULAR | Status: DC | PRN
  Administered 2022-03-26: 10 mg via INTRAVENOUS

## 2022-03-26 MED ORDER — ORAL CARE MOUTH RINSE: 15.0000 mL | Freq: Once | OROMUCOSAL | Status: AC

## 2022-03-26 MED ORDER — AMISULPRIDE (ANTIEMETIC) 5 MG/2ML IV SOLN: 10.0000 mg | Freq: Once | INTRAVENOUS | Status: DC | PRN

## 2022-03-26 MED ORDER — MIDAZOLAM HCL 2 MG/2ML IJ SOLN
INTRAMUSCULAR | Status: DC | PRN
  Administered 2022-03-26: 2 mg via INTRAVENOUS

## 2022-03-26 MED ORDER — OXYCODONE HCL 5 MG/5ML PO SOLN: 5.0000 mg | Freq: Once | ORAL | Status: AC | PRN

## 2022-03-26 MED ORDER — VITAMIN D 25 MCG (1000 UNIT) PO TABS
1000.0000 [IU] | ORAL_TABLET | Freq: Every day | ORAL | Status: DC
  Administered 2022-03-26: 1000 [IU] via ORAL
  Filled 2022-03-26: qty 1

## 2022-03-26 MED ORDER — CHLORHEXIDINE GLUCONATE 0.12 % MT SOLN
15.0000 mL | Freq: Once | OROMUCOSAL | Status: AC
  Administered 2022-03-26: 15 mL via OROMUCOSAL
  Filled 2022-03-26: qty 15

## 2022-03-26 MED ORDER — OXYCODONE HCL 5 MG PO TABS
ORAL_TABLET | ORAL | Status: AC
  Filled 2022-03-26: qty 1

## 2022-03-26 MED ORDER — SUGAMMADEX SODIUM 200 MG/2ML IV SOLN
INTRAVENOUS | Status: DC | PRN
Start: 2022-03-26 — End: 2022-03-26
  Administered 2022-03-26: 200 mg via INTRAVENOUS

## 2022-03-26 MED ORDER — ONDANSETRON HCL 4 MG/2ML IJ SOLN: 4.0000 mg | INTRAMUSCULAR | Status: DC | PRN

## 2022-03-26 MED ORDER — SCOPOLAMINE 1 MG/3DAYS TD PT72
1.0000 | MEDICATED_PATCH | TRANSDERMAL | Status: DC
  Administered 2022-03-26: 1.5 mg via TRANSDERMAL
  Filled 2022-03-26: qty 1

## 2022-03-26 MED ORDER — ACETAMINOPHEN 10 MG/ML IV SOLN
1000.0000 mg | Freq: Once | INTRAVENOUS | Status: DC | PRN
  Administered 2022-03-26: 1000 mg via INTRAVENOUS

## 2022-03-26 MED ORDER — 0.9 % SODIUM CHLORIDE (POUR BTL) OPTIME
TOPICAL | Status: DC | PRN
  Administered 2022-03-26: 1000 mL

## 2022-03-26 MED ORDER — ONDANSETRON 4 MG PO TBDP: 4.0000 mg | ORAL_TABLET | Freq: Three times a day (TID) | ORAL | 0 refills | Status: AC | PRN

## 2022-03-26 MED ORDER — FENTANYL CITRATE (PF) 100 MCG/2ML IJ SOLN
25.0000 ug | INTRAMUSCULAR | Status: DC | PRN
  Administered 2022-03-26: 25 ug via INTRAVENOUS
  Administered 2022-03-26: 50 ug via INTRAVENOUS
  Administered 2022-03-26: 25 ug via INTRAVENOUS

## 2022-03-26 MED ORDER — LACTATED RINGERS IV SOLN: INTRAVENOUS | Status: DC

## 2022-03-26 MED ORDER — ACETAMINOPHEN 325 MG PO TABS: 325.0000 mg | ORAL_TABLET | ORAL | Status: DC | PRN

## 2022-03-26 MED ORDER — FENTANYL CITRATE (PF) 250 MCG/5ML IJ SOLN
INTRAMUSCULAR | Status: AC
  Filled 2022-03-26: qty 5

## 2022-03-26 MED ORDER — HYDROCODONE-ACETAMINOPHEN 5-325 MG PO TABS
1.0000 | ORAL_TABLET | ORAL | Status: DC | PRN
  Filled 2022-03-26: qty 1

## 2022-03-26 MED ORDER — HYDROCODONE-ACETAMINOPHEN 7.5-325 MG PO TABS: 1.0000 | ORAL_TABLET | Freq: Four times a day (QID) | ORAL | 0 refills | Status: AC | PRN

## 2022-03-26 MED ORDER — IBUPROFEN 200 MG PO TABS
200.0000 mg | ORAL_TABLET | Freq: Four times a day (QID) | ORAL | Status: DC | PRN
  Administered 2022-03-26 – 2022-03-27 (×2): 200 mg via ORAL
  Filled 2022-03-26 (×2): qty 1

## 2022-03-26 MED ORDER — DEXTROSE-NACL 5-0.9 % IV SOLN: INTRAVENOUS | Status: DC

## 2022-03-26 MED ORDER — ACETAMINOPHEN 10 MG/ML IV SOLN
INTRAVENOUS | Status: AC
  Filled 2022-03-26: qty 100

## 2022-03-26 MED ORDER — ROCURONIUM BROMIDE 10 MG/ML (PF) SYRINGE
PREFILLED_SYRINGE | INTRAVENOUS | Status: DC | PRN
  Administered 2022-03-26: 50 mg via INTRAVENOUS

## 2022-03-26 MED ORDER — ACETAMINOPHEN 160 MG/5ML PO SOLN: 325.0000 mg | ORAL | Status: DC | PRN

## 2022-03-26 MED ORDER — LORATADINE 10 MG PO TABS
10.0000 mg | ORAL_TABLET | Freq: Every day | ORAL | Status: DC
  Administered 2022-03-26: 10 mg via ORAL
  Filled 2022-03-26: qty 1

## 2022-03-26 MED ORDER — ASPIRIN-ACETAMINOPHEN-CAFFEINE 250-250-65 MG PO TABS
1.0000 | ORAL_TABLET | Freq: Four times a day (QID) | ORAL | Status: DC | PRN
  Filled 2022-03-26: qty 1

## 2022-03-26 MED ORDER — PROPOFOL 10 MG/ML IV BOLUS
INTRAVENOUS | Status: DC | PRN
  Administered 2022-03-26: 130 mg via INTRAVENOUS

## 2022-03-26 MED ORDER — ONDANSETRON HCL 4 MG/2ML IJ SOLN
INTRAMUSCULAR | Status: DC | PRN
  Administered 2022-03-26: 4 mg via INTRAVENOUS

## 2022-03-26 MED ORDER — LIDOCAINE-EPINEPHRINE 1 %-1:100000 IJ SOLN
INTRAMUSCULAR | Status: AC
  Filled 2022-03-26: qty 1

## 2022-03-26 MED ORDER — LIDOCAINE-EPINEPHRINE 1 %-1:100000 IJ SOLN
INTRAMUSCULAR | Status: DC | PRN
  Administered 2022-03-26: 4 mL

## 2022-03-26 SURGICAL SUPPLY — 43 items
ADH SKN CLS APL DERMABOND .7 (GAUZE/BANDAGES/DRESSINGS) ×1
BAG COUNTER SPONGE SURGICOUNT (BAG) ×2 IMPLANT
BAG SPNG CNTER NS LX DISP (BAG) ×1
BLADE SURG 15 STRL LF DISP TIS (BLADE) IMPLANT
BLADE SURG 15 STRL SS (BLADE)
CANISTER SUCT 3000ML PPV (MISCELLANEOUS) ×2 IMPLANT
CLEANER TIP ELECTROSURG 2X2 (MISCELLANEOUS) ×2 IMPLANT
CNTNR URN SCR LID CUP LEK RST (MISCELLANEOUS) ×1 IMPLANT
CONT SPEC 4OZ STRL OR WHT (MISCELLANEOUS) ×2
CORD BIPOLAR FORCEPS 12FT (ELECTRODE) ×2 IMPLANT
COVER SURGICAL LIGHT HANDLE (MISCELLANEOUS) ×2 IMPLANT
DERMABOND ADVANCED (GAUZE/BANDAGES/DRESSINGS) ×1
DERMABOND ADVANCED .7 DNX12 (GAUZE/BANDAGES/DRESSINGS) ×1 IMPLANT
DRAIN HEMOVAC 7FR (DRAIN) IMPLANT
DRAIN JP 10F RND RADIO (DRAIN) ×1 IMPLANT
DRAIN SNY 10 ROU (WOUND CARE) IMPLANT
DRAPE HALF SHEET 40X57 (DRAPES) IMPLANT
ELECT COATED BLADE 2.86 ST (ELECTRODE) ×2 IMPLANT
ELECT REM PT RETURN 9FT ADLT (ELECTROSURGICAL) ×2
ELECTRODE REM PT RTRN 9FT ADLT (ELECTROSURGICAL) ×1 IMPLANT
EVACUATOR SILICONE 100CC (DRAIN) ×2 IMPLANT
FORCEPS BIPOLAR SPETZLER 8 1.0 (NEUROSURGERY SUPPLIES) ×2 IMPLANT
GAUZE 4X4 16PLY ~~LOC~~+RFID DBL (SPONGE) IMPLANT
GLOVE SURG ENC MOIS LTX SZ6.5 (GLOVE) IMPLANT
GLOVE SURG LTX SZ7.5 (GLOVE) ×2 IMPLANT
GOWN STRL REUS W/ TWL LRG LVL3 (GOWN DISPOSABLE) ×2 IMPLANT
GOWN STRL REUS W/TWL LRG LVL3 (GOWN DISPOSABLE) ×4
KIT BASIN OR (CUSTOM PROCEDURE TRAY) ×2 IMPLANT
KIT TURNOVER KIT B (KITS) ×2 IMPLANT
NDL PRECISIONGLIDE 27X1.5 (NEEDLE) ×1 IMPLANT
NEEDLE PRECISIONGLIDE 27X1.5 (NEEDLE) ×2 IMPLANT
NS IRRIG 1000ML POUR BTL (IV SOLUTION) ×2 IMPLANT
PAD ARMBOARD 7.5X6 YLW CONV (MISCELLANEOUS) ×4 IMPLANT
PENCIL FOOT CONTROL (ELECTRODE) ×2 IMPLANT
SHEARS HARMONIC 9CM CVD (BLADE) ×2 IMPLANT
STAPLER VISISTAT 35W (STAPLE) ×2 IMPLANT
SUT CHROMIC 3 0 PS 2 (SUTURE) ×2 IMPLANT
SUT CHROMIC 4 0 PS 2 18 (SUTURE) IMPLANT
SUT ETHILON 3 0 PS 1 (SUTURE) ×2 IMPLANT
SUT SILK 3 0 REEL (SUTURE) ×2 IMPLANT
SUT SILK 4 0 REEL (SUTURE) ×2 IMPLANT
TOWEL GREEN STERILE FF (TOWEL DISPOSABLE) ×2 IMPLANT
TRAY ENT MC OR (CUSTOM PROCEDURE TRAY) ×2 IMPLANT

## 2022-03-26 NOTE — Transfer of Care (Signed)
Immediate Anesthesia Transfer of Care Note ? ?Patient: Donna Hancock ? ?Procedure(s) Performed: THYROID LOBECTOMY (Left: Neck) ? ?Patient Location: PACU ? ?Anesthesia Type:General ? ?Level of Consciousness: drowsy and patient cooperative ? ?Airway & Oxygen Therapy: Patient Spontanous Breathing ? ?Post-op Assessment: Report given to RN and Post -op Vital signs reviewed and stable ? ?Post vital signs: Reviewed and stable ? ?Last Vitals:  ?Vitals Value Taken Time  ?BP 152/91 03/26/22 0857  ?Temp    ?Pulse 86 03/26/22 0858  ?Resp 15 03/26/22 0858  ?SpO2 95 % 03/26/22 0858  ?Vitals shown include unvalidated device data. ? ?Last Pain:  ?Vitals:  ? 03/26/22 0625  ?TempSrc:   ?PainSc: 0-No pain  ?   ? ?  ? ?Complications: No notable events documented. ?

## 2022-03-26 NOTE — Plan of Care (Signed)
?  Problem: Coping: ?Goal: Level of anxiety will decrease ?03/26/2022 1615 by Hezzie Bump, RN ?Outcome: Progressing ?  ?

## 2022-03-26 NOTE — Progress Notes (Signed)
? ?  ENT Progress Note: ?s/p Procedure(s): ?THYROID LOBECTOMY ? ? ?Subjective: ?Raspy voice and discomfort ? ?Objective: ?Vital signs in last 24 hours: ?Temp:  [97.8 ?F (36.6 ?C)-98.2 ?F (36.8 ?C)] 98.2 ?F (36.8 ?C) (03/29 1316) ?Pulse Rate:  [60-80] 72 (03/29 1316) ?Resp:  [10-22] 18 (03/29 1316) ?BP: (129-166)/(74-95) 133/81 (03/29 1316) ?SpO2:  [91 %-100 %] 94 % (03/29 1316) ?Weight:  [83.5 kg] 83.5 kg (03/29 0616) ?Weight change:  ?Last BM Date : 03/26/22 ? ?Intake/Output from previous day: ?No intake/output data recorded. ?Intake/Output this shift: ?Total I/O ?In: 800 [I.V.:800] ?Out: 20 [Drains:15; Blood:5] ? ?Labs: ?No results for input(s): WBC, HGB, HCT, PLT in the last 72 hours. ?No results for input(s): NA, K, CL, CO2, GLUCOSE, BUN, CALCIUM in the last 72 hours. ? ?Invalid input(s): CREATININR ? ?Studies/Results: ?No results found. ? ? ?PHYSICAL EXAM: ?Incision intact, JP drain in place.   ?No erythema or swelling. ?Breathy voice, no respiratory concerns. ? ? ?Assessment/Plan: ?Patient stable after left thyroid lobectomy. ?Monitor JP drain output overnight.  Plan discharge POD #1 ? ? ? ?Jerrell Belfast ?03/26/2022, 5:08 PM ?  ?

## 2022-03-26 NOTE — Anesthesia Postprocedure Evaluation (Signed)
Anesthesia Post Note ? ?Patient: Donna Hancock ? ?Procedure(s) Performed: THYROID LOBECTOMY (Left: Neck) ? ?  ? ?Patient location during evaluation: PACU ?Anesthesia Type: General ?Level of consciousness: awake and alert ?Pain management: pain level controlled ?Vital Signs Assessment: post-procedure vital signs reviewed and stable ?Respiratory status: spontaneous breathing, nonlabored ventilation, respiratory function stable and patient connected to nasal cannula oxygen ?Cardiovascular status: blood pressure returned to baseline and stable ?Postop Assessment: no apparent nausea or vomiting ?Anesthetic complications: no ? ? ?No notable events documented. ? ?Last Vitals:  ?Vitals:  ? 03/26/22 1045 03/26/22 1111  ?BP: 132/77 139/84  ?Pulse: 74 79  ?Resp: 13 (!) 21  ?Temp:  36.6 ?C  ?SpO2: 97% 93%  ?  ?Last Pain:  ?Vitals:  ? 03/26/22 1111  ?TempSrc:   ?PainSc: 6   ? ? ?  ?  ?  ?  ?  ?  ? ?Effie Berkshire ? ? ? ? ?

## 2022-03-26 NOTE — Anesthesia Preprocedure Evaluation (Addendum)
Anesthesia Evaluation  ?Patient identified by MRN, date of birth, ID band ?Patient awake ? ? ? ?Reviewed: ?Allergy & Precautions, NPO status , Patient's Chart, lab work & pertinent test results ? ?History of Anesthesia Complications ?(+) PONV and history of anesthetic complications ? ?Airway ?Mallampati: I ? ?TM Distance: >3 FB ?Neck ROM: Full ? ? ? Dental ? ?(+) Teeth Intact, Dental Advisory Given ?  ?Pulmonary ? ?  ?breath sounds clear to auscultation ? ? ? ? ? ? Cardiovascular ?negative cardio ROS ? ? ?Rhythm:Regular Rate:Normal ? ? ?  ?Neuro/Psych ? Headaches, Anxiety   ? GI/Hepatic ?Neg liver ROS, GERD  ,  ?Endo/Other  ?negative endocrine ROS ? Renal/GU ?negative Renal ROS  ? ?  ?Musculoskeletal ?negative musculoskeletal ROS ?(+)  ? Abdominal ?Normal abdominal exam  (+)   ?Peds ? Hematology ?negative hematology ROS ?(+)   ?Anesthesia Other Findings ? ? Reproductive/Obstetrics ? ?  ? ? ? ? ? ? ? ? ? ? ? ? ? ?  ?  ? ? ? ? ? ? ? ?Anesthesia Physical ?Anesthesia Plan ? ?ASA: 2 ? ?Anesthesia Plan: General  ? ?Post-op Pain Management:   ? ?Induction: Intravenous ? ?PONV Risk Score and Plan: 4 or greater and Ondansetron, Dexamethasone, Midazolam and Scopolamine patch - Pre-op ? ?Airway Management Planned: Oral ETT ? ?Additional Equipment: None ? ?Intra-op Plan:  ? ?Post-operative Plan: Extubation in OR ? ?Informed Consent: I have reviewed the patients History and Physical, chart, labs and discussed the procedure including the risks, benefits and alternatives for the proposed anesthesia with the patient or authorized representative who has indicated his/her understanding and acceptance.  ? ? ? ?Dental advisory given ? ?Plan Discussed with: CRNA ? ?Anesthesia Plan Comments:   ? ? ? ? ? ?Anesthesia Quick Evaluation ? ?

## 2022-03-26 NOTE — Discharge Instructions (Signed)
You may shower and use soap and water. Do not use any creams, oils or ointment. ° °

## 2022-03-26 NOTE — Op Note (Signed)
OPERATIVE REPORT ? ?DATE OF SURGERY: 03/26/2022 ? ?PATIENT:  Donna Hancock,  56 y.o. female ? ?PRE-OPERATIVE DIAGNOSIS:  Left thyroid nodule ? ?POST-OPERATIVE DIAGNOSIS:  Left thyroid nodule ? ?PROCEDURE:  Procedure(s): ?THYROID LOBECTOMY, left ? ?SURGEON:  Beckie Salts, MD ? ?ASSISTANTS: RNFA ? ?ANESTHESIA:   General  ? ?EBL: 20 ml ? ?DRAINS: 10 [place] round JP ? ?LOCAL MEDICATIONS USED: 1% Xylocaine with epinephrine ? ?SPECIMEN: Left thyroid lobe ? ?COUNTS:  Correct ? ?PROCEDURE DETAILS: ?The patient was taken to the operating room and placed on the operating table in the supine position. A shoulder roll was placed beneath the shoulder blades and the neck was extended. The neck was prepped and draped in a standard fashion. A low collar transverse incision was outlined with a marking pen and was incised with a #15 scalpel. Dissection was continued down through the platysma layer.  Self-retaining retractors were used throughout the case. ? ?The midline fascia was divided.  The strap muscles were reflected off of the left thyroid lobe and retracted laterally.  The thyroid lobe was palpated and the larger nodule was palpable in the mid to lower portion and a second very small nodule was also palpable.  The lobe was reflected medially while the upper vasculature was separately identified ligated and divided using the harmonic dissector.  A suspected superior parathyroid was identified and preserved with its blood supply.  As the dissection continued along the capsule the recurrent nerve was not identified and was felt to be in the deeper tissue along the tracheoesophageal groove.  The middle thyroid vein was ligated and divided.  The inferior vasculature was divided in a similar fashion.  The ligament of Gwenlyn Found was dissected off of the trachea.  The right side of the isthmus was divided using the harmonic scalpel.  The specimen was delivered and sent for pathologic valuation.  Hemostasis was completed using bipolar cautery  and 4-0 silk ties as needed.  There is no palpable mass on the right side. ? ?The drain was secured in place with a nylon suture. The midline fascia was reapproximated with interrupted chromic suture. The platysma layer was also reapproximated with interrupted chromic suture. A running subcuticular closure was accomplished. Dermabond was used on the skin. The drain was charged. The patient was awakened, extubated and transferred to recovery in stable condition. ? ? ?PATIENT DISPOSITION:  To PACU, stable ? ? ? ? ? ? ? ? ? ? ? ? ? ?

## 2022-03-26 NOTE — Interval H&P Note (Signed)
History and Physical Interval Note: ? ?03/26/2022 ?7:18 AM ? ?Donna Hancock  has presented today for surgery, with the diagnosis of Left thyroid nodule.  The various methods of treatment have been discussed with the patient and family. After consideration of risks, benefits and other options for treatment, the patient has consented to  Procedure(s): ?THYROID LOBECTOMY (Left) as a surgical intervention.  The patient's history has been reviewed, patient examined, no change in status, stable for surgery.  I have reviewed the patient's chart and labs.  Questions were answered to the patient's satisfaction.   ? ? ?[name] ? ? ?

## 2022-03-26 NOTE — Anesthesia Procedure Notes (Signed)
Procedure Name: Intubation ?Date/Time: 03/26/2022 7:50 AM ?Performed by: Thelma Comp, CRNA ?Pre-anesthesia Checklist: Patient identified, Emergency Drugs available, Suction available and Patient being monitored ?Patient Re-evaluated:Patient Re-evaluated prior to induction ?Oxygen Delivery Method: Circle System Utilized ?Preoxygenation: Pre-oxygenation with 100% oxygen ?Induction Type: IV induction ?Ventilation: Mask ventilation without difficulty ?Tube type: Oral ?Tube size: 7.0 mm ?Number of attempts: 1 ?Airway Equipment and Method: Stylet ?Placement Confirmation: ETT inserted through vocal cords under direct vision, positive ETCO2 and breath sounds checked- equal and bilateral ?Secured at: 21 cm ?Tube secured with: Tape ?Dental Injury: Teeth and Oropharynx as per pre-operative assessment  ? ? ? ? ?

## 2022-03-27 ENCOUNTER — Encounter (HOSPITAL_COMMUNITY): Payer: Self-pay | Admitting: Otolaryngology

## 2022-03-27 DIAGNOSIS — C73 Malignant neoplasm of thyroid gland: Secondary | ICD-10-CM | POA: Diagnosis not present

## 2022-03-27 LAB — SURGICAL PATHOLOGY

## 2022-03-27 MED ORDER — HYDROCODONE-ACETAMINOPHEN 7.5-325 MG/15ML PO SOLN: 15.0000 mL | Freq: Four times a day (QID) | ORAL | 0 refills | Status: AC | PRN

## 2022-03-27 NOTE — Discharge Summary (Addendum)
Physician Discharge Summary  ?Patient ID: ?Donna Hancock ?MRN: [id number] ?DOB/AGE: 09-24-1966 56 y.o. ? ?Admit date: 03/26/2022 ?Discharge date: 03/27/2022 ? ?Admission Diagnoses: Thyroid mass ? ?Discharge Diagnoses:  ?Principal Problem: ?  Thyroid nodule ? ? ?Discharged Condition: good ? ?Hospital Course: No complications during hospital stay.  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 ? ?Consults: none ? ?Significant Diagnostic Studies: none ? ?Treatments: surgery: Thyroid lobectomy ? ?Discharge Exam: ?Blood pressure (!) 142/81, pulse 74, temperature 98.6 ?F (37 ?C), temperature source Oral, resp. rate 18, height 5' 7.5" (1.715 m), weight 83.5 kg, SpO2 100 %. ?PHYSICAL EXAM: ?Awake and alert.  Swallowing well.  Incision looks excellent.  Drain out.  Voice is breathy.  Breathing is clear. ? ?Disposition: Discharge disposition: 01-Home or Self Care ? ? ? ? ? ? ?Discharge Instructions   ? ? Diet - low sodium heart healthy   Complete by: As directed ?  ? Increase activity slowly   Complete by: As directed ?  ? No wound care   Complete by: As directed ?  ? ?  ? ? ? ?Signed: ?[name] ?03/27/2022, 8:34 AM ? ? ? ?

## 2022-03-27 NOTE — Progress Notes (Signed)
Patient given discharge instructions and stated understanding. 

## 2022-03-27 NOTE — Plan of Care (Signed)
Pt scheduled for discharge this morning, all goals of care achieved ?

## 2022-03-27 NOTE — Progress Notes (Signed)
Patient ID: Donna Hancock, female   DOB: 1966-08-01, 56 y.o.   MRN: [id number] ?Subjective: ?Complains of some pain and breathiness but otherwise doing well. ? ?Objective: ?Vital signs in last 24 hours: ?Temp:  [97.6 ?F (36.4 ?C)-98.6 ?F (37 ?C)] 98.6 ?F (37 ?C) (03/30 0427) ?Pulse Rate:  [60-87] 74 (03/30 0427) ?Resp:  [10-22] 18 (03/30 0427) ?BP: (106-166)/(59-94) 142/81 (03/30 0427) ?SpO2:  [91 %-100 %] 100 % (03/30 0427) ?Weight change:  ?Last BM Date : 03/26/22 ? ?Intake/Output from previous day: ?03/29 0701 - 03/30 0700 ?In: 800 [I.V.:800] ?Out: 30 [Drains:25; Blood:5] ?Intake/Output this shift: ?No intake/output data recorded. ? ?PHYSICAL EXAM: ?Incision looks excellent.  Drain removed.  Breathing is clear.  Voice is breathy. ? ?Lab Results: ?No results for input(s): WBC, HGB, HCT, PLT in the last 72 hours. ?BMET ?No results for input(s): NA, K, CL, CO2, GLUCOSE, BUN, CREATININE, CALCIUM in the last 72 hours. ? ?Studies/Results: ?No results found. ? ?Medications: I have reviewed the patient's current medications. ? ?Assessment/Plan: ?Postop day 1 doing well.  Will discharge today.  Follow-up in 1 or 2 weeks. ? LOS: 0 days  ? ? ? ?[name] ?03/27/2022, 8:33 AM ? ? ?

## 2022-03-27 NOTE — Plan of Care (Signed)

## 2022-04-16 ENCOUNTER — Emergency Department (HOSPITAL_BASED_OUTPATIENT_CLINIC_OR_DEPARTMENT_OTHER): Payer: 59

## 2022-04-16 ENCOUNTER — Emergency Department (HOSPITAL_BASED_OUTPATIENT_CLINIC_OR_DEPARTMENT_OTHER)
Admission: EM | Admit: 2022-04-16 | Discharge: 2022-04-16 | Disposition: A | Discharge status: Home / Self Care | Payer: 59 | Attending: Emergency Medicine | Admitting: Emergency Medicine

## 2022-04-16 ENCOUNTER — Encounter (HOSPITAL_BASED_OUTPATIENT_CLINIC_OR_DEPARTMENT_OTHER): Payer: Self-pay

## 2022-04-16 ENCOUNTER — Other Ambulatory Visit: Payer: Self-pay

## 2022-04-16 DIAGNOSIS — E876 Hypokalemia: Secondary | ICD-10-CM | POA: Diagnosis not present

## 2022-04-16 DIAGNOSIS — R300 Dysuria: Secondary | ICD-10-CM | POA: Diagnosis not present

## 2022-04-16 DIAGNOSIS — R072 Precordial pain: Secondary | ICD-10-CM | POA: Insufficient documentation

## 2022-04-16 DIAGNOSIS — R0602 Shortness of breath: Secondary | ICD-10-CM | POA: Insufficient documentation

## 2022-04-16 DIAGNOSIS — R499 Unspecified voice and resonance disorder: Secondary | ICD-10-CM | POA: Insufficient documentation

## 2022-04-16 DIAGNOSIS — Z20822 Contact with and (suspected) exposure to covid-19: Secondary | ICD-10-CM | POA: Insufficient documentation

## 2022-04-16 LAB — CBC WITH DIFFERENTIAL/PLATELET
Abs Immature Granulocytes: 0.04 10*3/uL (ref 0.00–0.07)
Basophils Absolute: 0.1 10*3/uL (ref 0.0–0.1)
Basophils Relative: 1 %
Eosinophils Absolute: 0.1 10*3/uL (ref 0.0–0.5)
Eosinophils Relative: 2 %
HCT: 40.5 % (ref 36.0–46.0)
Hemoglobin: 14.1 g/dL (ref 12.0–15.0)
Immature Granulocytes: 1 %
Lymphocytes Relative: 31 %
Lymphs Abs: 1.8 10*3/uL (ref 0.7–4.0)
MCH: 29.1 pg (ref 26.0–34.0)
MCHC: 34.8 g/dL (ref 30.0–36.0)
MCV: 83.7 fL (ref 80.0–100.0)
Monocytes Absolute: 0.3 10*3/uL (ref 0.1–1.0)
Monocytes Relative: 6 %
Neutro Abs: 3.5 10*3/uL (ref 1.7–7.7)
Neutrophils Relative %: 59 %
Platelets: 170 10*3/uL (ref 150–400)
RBC: 4.84 MIL/uL (ref 3.87–5.11)
RDW: 13.2 % (ref 11.5–15.5)
WBC: 5.9 10*3/uL (ref 4.0–10.5)
nRBC: 0 % (ref 0.0–0.2)

## 2022-04-16 LAB — URINALYSIS, ROUTINE W REFLEX MICROSCOPIC
Bilirubin Urine: NEGATIVE
Glucose, UA: NEGATIVE mg/dL
Hgb urine dipstick: NEGATIVE
Ketones, ur: NEGATIVE mg/dL
Leukocytes,Ua: NEGATIVE
Nitrite: NEGATIVE
Protein, ur: NEGATIVE mg/dL
Specific Gravity, Urine: 1.025 (ref 1.005–1.030)
pH: 6.5 (ref 5.0–8.0)

## 2022-04-16 LAB — COMPREHENSIVE METABOLIC PANEL
ALT: 27 U/L (ref 0–44)
AST: 24 U/L (ref 15–41)
Albumin: 3.9 g/dL (ref 3.5–5.0)
Alkaline Phosphatase: 39 U/L (ref 38–126)
Anion gap: 8 (ref 5–15)
BUN: 12 mg/dL (ref 6–20)
CO2: 23 mmol/L (ref 22–32)
Calcium: 8.7 mg/dL — ABNORMAL LOW (ref 8.9–10.3)
Chloride: 107 mmol/L (ref 98–111)
Creatinine, Ser: 0.79 mg/dL (ref 0.44–1.00)
GFR, Estimated: >60 mL/min (ref >60)
Glucose, Bld: 146 mg/dL — ABNORMAL HIGH (ref 70–99)
Potassium: 3.4 mmol/L — ABNORMAL LOW (ref 3.5–5.1)
Sodium: 138 mmol/L (ref 135–145)
Total Bilirubin: 0.7 mg/dL (ref 0.3–1.2)
Total Protein: 7.2 g/dL (ref 6.5–8.1)

## 2022-04-16 LAB — TROPONIN I (HIGH SENSITIVITY)
Troponin I (High Sensitivity): <2 ng/L (ref <18)
Troponin I (High Sensitivity): <2 ng/L (ref <18)

## 2022-04-16 LAB — RESP PANEL BY RT-PCR (FLU A&B, COVID) ARPGX2
Influenza A by PCR: NEGATIVE
Influenza B by PCR: NEGATIVE
SARS Coronavirus 2 by RT PCR: NEGATIVE

## 2022-04-16 LAB — LIPASE, BLOOD: Lipase: 29 U/L (ref 11–51)

## 2022-04-16 MED ORDER — IOHEXOL 300 MG/ML  SOLN: 100.0000 mL | Freq: Once | INTRAMUSCULAR | Status: DC | PRN

## 2022-04-16 MED ORDER — IOHEXOL 350 MG/ML SOLN
100.0000 mL | Freq: Once | INTRAVENOUS | Status: AC | PRN
  Administered 2022-04-16: 75 mL via INTRAVENOUS

## 2022-04-16 NOTE — ED Triage Notes (Signed)
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 States has had shortness of breath intermittently since the surgery, worsening. States has lost voice. C/o bilateral lower rib pain. ?

## 2022-04-16 NOTE — ED Provider Notes (Signed)
? ?Emergency Department Provider Note ? ? ?I have reviewed the triage vital signs and the nursing notes. ? ? ?HISTORY ? ?Chief Complaint ?Shortness of Breath ? ? ?HPI ?Donna Hancock is a 56 y.o. female with PMH reviewed below presents to the ED with CP, SOB, and some throat discomfort 3 weeks after thyroidectomy. Surgery performed with Dr. Constance Holster with ENT. Overall, patient has had a fairly easy post-operative course. She reports that neck swelling is decreasing. She continues to have a very hoarse voice and has had new onset SOB symptoms. Symptoms are worsening over the last several days. She feels tightness across the lower chest and "congestion" feeling in the throat. As an aside, she also describes some mild dysuria without flank or lower back pain. Notes that she has tried to establish care with endocrinology but is having a lot of difficulty getting an appointment.   ? ? ?Past Medical History:  ?Diagnosis Date  ? ANEMIA-IRON DEFICIENCY 12/21/2008  ? Likely related to menstruation- now resolved after ablation    ? Anxiety   ? IBS (irritable bowel syndrome)   ? stress related  ? Panic attack   ? Pneumonia   ? PONV (postoperative nausea and vomiting)   ? Tubular adenoma of colon   ? 11/11/13 with 5 year repeat  ? ? ?Review of Systems ? ?Constitutional: No fever/chills ?Eyes: No visual changes. ?ENT: Positive sore throat with neck fullness/congestion feeling.  ?Cardiovascular: Positive lower chest pain. ?Respiratory: Positive shortness of breath. ?Gastrointestinal: No abdominal pain.  No nausea, no vomiting.  No diarrhea.  No constipation. ?Genitourinary: Negative for dysuria. ?Musculoskeletal: Negative for back pain. ?Skin: Negative for rash. ?Neurological: Negative for headaches. ? ? ?____________________________________________ ? ? ?PHYSICAL EXAM: ? ?VITAL SIGNS: ?ED Triage Vitals  ?Enc Vitals Group  ?   BP 04/16/22 1221 (!) 139/100  ?   Pulse Rate 04/16/22 1221 88  ?   Resp 04/16/22 1221 18  ?   Temp 04/16/22 1221  97.6 ?F (36.4 ?C)  ?   Temp Source 04/16/22 1221 Oral  ?   SpO2 04/16/22 1221 97 %  ?   Weight 04/16/22 1219 184 lb (83.5 kg)  ?   Height 04/16/22 1219 5' 7.5" (1.715 m)  ? ?Constitutional: Alert and oriented. Well appearing and in no acute distress. ?Eyes: Conjunctivae are normal.  ?Head: Atraumatic. ?: No congestion/rhinnorhea. ?Mouth/Throat: Mucous membranes are moist.  Oropharynx non-erythematous. No trismus. Managing oral secretions. Patient with soft, whispering type voice.  ?Neck: No stridor. Well-appearing incision without redness or swelling. Mild external swelling and submandibular tenderness. No cellulitis.  ?Cardiovascular: Normal rate, regular rhythm. Good peripheral circulation. Grossly normal heart sounds.   ?Respiratory: Normal respiratory effort.  No retractions. Lungs CTAB. ?Gastrointestinal: Soft and nontender. No distention.  ?Musculoskeletal: No gross deformities of extremities. ?Neurologic:  Normal speech and language. ?Skin:  Skin is warm, dry and intact. No rash noted. ? ?____________________________________________ ?  ?LABS ?(all labs ordered are listed, but only abnormal results are displayed) ? ?Labs Reviewed  ?COMPREHENSIVE METABOLIC PANEL - Abnormal; Notable for the following components:  ?    Result Value  ? Potassium 3.4 (*)   ? Glucose, Bld 146 (*)   ? Calcium 8.7 (*)   ? All other components within normal limits  ?RESP PANEL BY RT-PCR (FLU A&B, COVID) ARPGX2  ?LIPASE, BLOOD  ?CBC WITH DIFFERENTIAL/PLATELET  ?URINALYSIS, ROUTINE W REFLEX MICROSCOPIC  ?TROPONIN I (HIGH SENSITIVITY)  ?TROPONIN I (HIGH SENSITIVITY)  ? ?____________________________________________ ? ?EKG ? ?  EKG Interpretation ? ?Date/Time:  Wednesday April 16 2022 12:22:54 EDT ?Ventricular Rate:  89 ?PR Interval:  151 ?QRS Duration: 104 ?QT Interval:  376 ?QTC Calculation: 458 ?R Axis:   28 ?Text Interpretation: Sinus rhythm Low voltage, precordial leads RSR' in V1 or V2, right VCD or RVH Borderline T abnormalities,  anterior leads Confirmed by Nanda Quinton [phone]) on 04/16/2022 12:24:41 PM ?  ? ?  ? ? ?____________________________________________ ? ?RADIOLOGY ? ?CT Soft Tissue Neck W Contrast ? ?Result Date: 04/16/2022 ?CLINICAL DATA:  Postoperative swelling, 3 weeks status post thyroid surgery, globus sensation, hoarse EXAM: CT NECK WITH CONTRAST TECHNIQUE: Multidetector CT imaging of the neck was performed using the standard protocol following the bolus administration of intravenous contrast. RADIATION DOSE REDUCTION: This exam was performed according to the departmental dose-optimization program which includes automated exposure control, adjustment of the mA and/or kV according to patient size and/or use of iterative reconstruction technique. CONTRAST:  36m OMNIPAQUE IOHEXOL 350 MG/ML SOLN COMPARISON:  05/30/2020. FINDINGS: Pharynx and larynx: Normal. No mass or swelling. Salivary glands: No inflammation, mass, or stone. Thyroid: Status post left thyroid lobectomy.  Otherwise normal. Lymph nodes: No enlarged or abnormal density lymph nodes. A prominent left level 1B lymph node measures up to 8 mm in short axis, unchanged compared to 2021. Vascular: Negative. Limited intracranial: Negative. Visualized orbits: Negative. Mastoids and visualized paranasal sinuses: Clear. Skeleton: No acute osseous abnormality. Upper chest: Please see same-day CT chest Other: Mild stranding in the soft tissues anterior to the thyroid, likely postoperative. No focal collection or significant inflammation. IMPRESSION: 1. Status post left thyroid surgery, with minimal postsurgical changes in the neck soft tissues anterior to the surgical bed. No focal collection or significant inflammation. 2. Otherwise negative CT of the neck. Electronically Signed   By: AMerilyn BabaM.D.   On: 04/16/2022 14:37  ? ?CT Angio Chest PE W and/or Wo Contrast ? ?Result Date: 04/16/2022 ?CLINICAL DATA:  Pulmonary embolus suspected EXAM: CT ANGIOGRAPHY CHEST WITH CONTRAST  TECHNIQUE: Multidetector CT imaging of the chest was performed using the standard protocol during bolus administration of intravenous contrast. Multiplanar CT image reconstructions and MIPs were obtained to evaluate the vascular anatomy. RADIATION DOSE REDUCTION: This exam was performed according to the departmental dose-optimization program which includes automated exposure control, adjustment of the mA and/or kV according to patient size and/or use of iterative reconstruction technique. CONTRAST:  743mOMNIPAQUE IOHEXOL 350 MG/ML SOLN COMPARISON:  None. FINDINGS: Cardiovascular: Satisfactory contrast opacification of the pulmonary arteries. No evidence of pulmonary embolus. Normal heart size. No pericardial effusion. No significant vascular findings. Mediastinum/Nodes: Small hiatal hernia. Prior left thyroidectomy with mild adjacent soft tissue stranding which is likely postsurgical. No pathologically enlarged lymph nodes seen in the chest. Lungs/Pleura: Central airways are patent. No consolidation, pleural effusion or pneumothorax. Upper Abdomen: No acute abnormality. Musculoskeletal: No chest wall abnormality. No acute or significant osseous findings. Review of the MIP images confirms the above findings. IMPRESSION: 1. No evidence of pulmonary embolus. 2. Postsurgical changes of left thyroidectomy. Electronically Signed   By: LeYetta Glassman.D.   On: 04/16/2022 14:35   ? ?____________________________________________ ? ? ?PROCEDURES ? ?Procedure(s) performed:  ? ?Procedures ? ?None  ?____________________________________________ ? ? ?INITIAL IMPRESSION / ASSESSMENT AND PLAN / ED COURSE ? ?Pertinent labs & imaging results that were available during my care of the patient were reviewed by me and considered in my medical decision making (see chart for details). ?  ?This patient is Presenting for Evaluation  of CP, which does require a range of treatment options, and is a complaint that involves a high risk of  morbidity and mortality. ? ?The Differential Diagnoses includes all life-threatening causes for chest pain. This includes but is not exclusive to acute coronary syndrome, aortic dissection, pulmonary embolism,

## 2022-04-16 NOTE — Discharge Instructions (Signed)
You were seen in the emergency department today with chest discomfort.  The CT scan of your neck and chest are reassuring and did not show a clear cause for your symptoms.  Please follow closely with your ENT and endocrinology doctors.  We also discussed touching base with your primary care doctor to order some basic blood test pending these follow-up appointments.  If you develop any new or suddenly worsening symptoms please return to the emergency department for reevaluation. ?

## 2022-05-12 NOTE — H&P (Addendum)
Return visit. Voice still breathy. She is struggling with this. She needs to find a new job but is difficult for her. She is in [employer]. Her breathing is clear. Incision looks excellent. 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 We discussed the possible utility of medialization laryngoplasty. She will think about this. She has a very difficult time with the fiberoptic examination so this would need to be done under general anesthesia. We will send her for TSH testing today. I will discuss results with her when available. I am referring her to Dr. Buddy Duty for consideration of additional work-up for the thyroid cancer. ? ? ?HPI:  ? ?Donna Hancock is a 56 y.o. female who presents as a consult Patient.  ? ?Referring Provider: Rudene Anda, MD ? ?Chief complaint: Thyroid nodules. ? ?HPI: Here for second opinion. She was found to have thyroid nodules on the left several years ago. Repeat ultrasound this year revealed changes and 2 of the nodules were considered suspicious. Recommendation was made for needle aspirate. Otherwise in good health. She is a regular Q-tip user. ? ?PMH/Meds/All/SocHx/FamHx/ROS:  ? ?Past Medical History:  ?Diagnosis Date  ? Actinic keratosis  ? Anxiety  ? Colon polyp  ? History of adenomatous polyp of colon  ? IBS (irritable bowel syndrome)  ? Systemic lupus erythematosus ([place])  ? ?Past Surgical History:  ?Procedure Laterality Date  ? COLONOSCOPY 2014, 2019  ? COLONOSCOPY W/ BIOPSIES AND POLYPECTOMY  ? ENDOMETRIAL ABLATION  ? GALLBLADDER SURGERY  ? HEMORRHOID BANDING 2012, 2019  ? SKIN BIOPSY  ? ?No family history of bleeding disorders, wound healing problems or difficulty with anesthesia.  ? ?Social History  ? ?Socioeconomic History  ? Marital status: Single  ?Spouse name: Not on file  ? Number of children: Not on file  ? Years of education: Not on file  ? Highest education level: Not on file  ?Occupational History  ? Not on file  ?Tobacco Use  ? Smoking  status: Never Smoker  ? Smokeless tobacco: Never Used  ?Substance and Sexual Activity  ? Alcohol use: No  ? Drug use: No  ? Sexual activity: Not on file  ?Other Topics Concern  ? Not on file  ?Social History Narrative  ? Not on file  ? ?Social Determinants of Health  ? ?Financial Resource Strain: Not on file  ?Food Insecurity: Not on file  ?Transportation Needs: Not on file  ?Physical Activity: Not on file  ?Stress: Not on file  ?Social Connections: Not on file  ?Housing Stability: Not on file  ? ?Current Outpatient Medications:  ? aspirin/acetaminophen/caffeine (EXCEDRIN MIGRAINE ORAL), Take by mouth., Disp: , Rfl:  ? benzonatate (TESSALON PERLES) 200 MG capsule, Take 1 capsule (200 mg total) by mouth 3 (three) times daily as needed for Cough., Disp: 30 capsule, Rfl: 1 ? clobetasoL (TEMOVATE) 0.05 % ointment, Apply to AFFECTED areas of hand twice daily ONLY on Saturday and Sunday. NEVER to face/groin/armpits., Disp: 60 g, Rfl: 2 ? fluticasone propionate (FLONASE) 50 mcg/actuation nasal spray, 2 sprays by Each Nare route daily., Disp: 1 Inhaler, Rfl: 1 ? hydrOXYchloroQUINE (PLAQUENIL) 200 mg tablet, Take 1 tablet (200 mg total) by mouth 2 times daily., Disp: 60 tablet, Rfl: 1 ? LORazepam (ATIVAN) 0.5 MG tablet, TAKE 1 TABLET BY MOUTH TWICE DAILY AS NEEDED FOR ANXIETY FOR UP TO 30 DAYS, Disp: 60 tablet, Rfl: 2 ? ipratropium (ATROVENT) 42 mcg (0.06 %) nasal spray, 2 sprays by Nasal route 3  times daily for 21 days., Disp: 15 mL, Rfl: 0 ? ?A complete ROS was performed with pertinent positives/negatives noted in the HPI. The remainder of the ROS are negative. ? ? ?Physical Exam:  ? ?BP 141/93  Pulse 76  Temp 97.1 ?F (36.2 ?C)  Ht 1.702 m ('5\' 7"'$ )  Wt 83.9 kg (185 lb)  BMI 28.98 kg/m?  ? ?General: Healthy and alert, in no distress, breathing easily. Normal affect. In a pleasant mood. ?Head: Normocephalic, atraumatic. No masses, or scars. ?Eyes: Pupils are equal, and reactive to light. Vision is grossly intact. No  spontaneous or gaze nystagmus. ?Ears: Ear canals are clear on the left with impacted cerumen on the right that was cleaned out under the microscope. Tympanic membranes are intact, with normal landmarks and the middle ears are clear and healthy. ?Hearing: Grossly normal. ?Nose: Nasal cavities are clear with healthy mucosa, no polyps or exudate. Airways are patent. ?Face: No masses or scars, facial nerve function is symmetric. ?Oral Cavity: No mucosal abnormalities are noted. Tongue with normal mobility. Dentition appears healthy. ?Oropharynx: Tonsils are symmetric. There are no mucosal masses identified. Tongue base appears normal and healthy. ?Larynx/Hypopharynx: indirect exam reveals healthy, mobile vocal cords, without mucosal lesions in the hypopharynx or larynx. ?Chest: Deferred ?Neck: No palpable masses, no cervical adenopathy, no thyroid nodules or enlargement. ?Neuro: Cranial nerves II-XII with normal function. ?Balance: Normal gate. ?Other findings: none. ? ?Independent Review of Additional Tests or Records:  ?none ? ?Procedures:  ?Procedure note: ? ?Indications: Cerumen impaction ? ?Details of cerumen removal were discussed with the patient and all questions were answered. ? ?Procedure: ? ?Using the operating microscope, the right side was cleaned of cerumen using otologic forceps. There was no signs of infection. Symptoms were releaved. ? ?She tolerated this procedure well. ?There were no complications. ? ?Impression & Plans:  ?Left side thyroid nodules, 2, suspicious on ultrasound. We will schedule for ultrasound-guided needle aspirate and radiology.  ?

## 2022-05-14 ENCOUNTER — Other Ambulatory Visit: Payer: Self-pay

## 2022-05-14 ENCOUNTER — Encounter (HOSPITAL_COMMUNITY): Payer: Self-pay | Admitting: Otolaryngology

## 2022-05-14 NOTE — Progress Notes (Signed)
PCP - Dr. Jacelyn Grip ? ?Cardiologist - Denies ? ?EP- Denies ? ?Endocrine- Denies ? ?Pulm- Denies ? ?Chest x-ray - Denies ? ?EKG - 04/16/22 (E) ? ?Stress Test - Denies ? ?ECHO - Denies ? ?Cardiac Cath - Denies ? ?AICD-na ?PM-na ?LOOP-na ? ?Nerve Stimulator- Denies ? ?Dialysis- Denies ? ?Sleep Study - Denies ?CPAP - Denies ? ?LABS- 04/16/22(E): CBC w/D, CMP ?05/16/22: POC UPreg ? ?ASA- Denies ? ?ERAS- No ? ?HA1C- Denies ? ?Anesthesia- Yes- recent ED visit ? ?Pt denies having chest pain, sob, or fever during the pre-op phone call. All instructions explained to the pt, with a verbal understanding of the material including: as of today, stop taking all Aspirin (unless instructed by your doctor) and Other Aspirin containing products, Vitamins, Fish oils, and Herbal medications. Also stop all NSAIDS i.e. Advil, Ibuprofen, Motrin, Aleve, Anaprox, Naproxen, BC, Goody Powders, and all Supplements.  Pt also instructed to wear a mask and social distance if she goes out. The opportunity to ask questions was provided.  ?

## 2022-05-14 NOTE — Pre-Procedure Instructions (Incomplete)
SDW CALL ? ?Patient was given pre-op instructions over the phone. The opportunity was given for the patient to ask questions. No further questions asked. Patient verbalized understanding of instructions given. ? ? ?PCP - Rudene Anda, MD ?Cardiologist -  ? ?PPM/ICD -  ?Device Orders -  ?Rep Notified -  ? ?Chest x-ray -  ?EKG - 04/16/22 ?Stress Test -  ?ECHO -  ?Cardiac Cath -  ?Chest CT- 04/16/22 ? ?Sleep Study -  ?CPAP -  ? ?Fasting Blood Sugar -  ?Checks Blood Sugar _____ times a day ? ?Blood Thinner Instructions: ?Aspirin Instructions: ? ?ERAS Protcol - NPO order ?PRE-SURGERY Ensure or G2-  ? ?COVID TEST- Covid test DOS ? ? ?Anesthesia review:  ? ?Patient denies shortness of breath, fever, cough and chest pain over the phone call ? ? ? ?Surgical Instructions ? ? ? Your procedure is scheduled on Friday, May 19 ? Report to [facility] Main Entrance "A" at 8:45 A.M., then check in with the Admitting office. ? Call this number if you have problems the morning of surgery: ? [phone] ? ? ? Remember: ? Do not eat or drink after midnight the night before your surgery ? ? Take these medicines the morning of surgery with A SIP OF WATER:  ?fluticasone (FLONASE) ?cetirizine (ZYRTEC)  if needed ?LORazepam (ATIVAN)  if needed ?acetaminophen (TYLENOL) 500 MG tablet if needed ? ?As of today, STOP taking any Aspirin (unless otherwise instructed by your surgeon) Aleve, Naproxen, Ibuprofen, Motrin, Advil, Goody's, BC's, all herbal medications, fish oil, and all vitamins. ? ?[facility] is not responsible for any belongings or valuables. .  ? ?Do NOT Smoke (Tobacco/Vaping)  24 hours prior to your procedure ? ?If you use a CPAP at night, you may bring your mask for your overnight stay. ?  ?Contacts, glasses, hearing aids, dentures or partials may not be worn into surgery, please bring cases for these belongings ?  ?Patients discharged the day of surgery will not be allowed to drive home, and someone needs to stay with them for 24  hours. ? ? ?SURGICAL WAITING ROOM VISITATION ?No visitors are allowed in pre-op area with patient.  ?Patients having surgery or a procedure in a hospital may have two support people in the waiting room. ?Children under the age of 69 must have an adult with them who is not the patient. ?They may stay in the waiting area during the procedure and may switch out with other visitors. If the patient needs to stay at the hospital during part of their recovery, the visitor guidelines for inpatient rooms apply. ? ?Please refer to the [facility] website for the visitor guidelines for Inpatients (after your surgery is over and you are in a regular room).  ? ? ? ?Special instructions:   ? ?Oral Hygiene is also important to reduce your risk of infection.  Remember - BRUSH YOUR TEETH THE MORNING OF SURGERY WITH YOUR REGULAR TOOTHPASTE ? ? ?Day of Surgery: ? ?Take a shower the day of or night before with antibacterial soap. ?Wear Clean/Comfortable clothing the morning of surgery ?Do not apply any deodorants/lotions.   ?Do not wear jewelry or makeup ?Do not wear lotions, powders, perfumes/colognes, or deodorant. ?Do not shave 48 hours prior to surgery.  Men may shave face and neck. ?Do not bring valuables to the hospital. ?Do not wear nail polish, gel polish, artificial nails, or any other type of covering on natural nails (fingers and toes) ?If you have artificial nails or gel  coating that need to be removed by a nail salon, please have this removed prior to surgery. Artificial nails or gel coating may interfere with anesthesia's ability to adequately monitor your vital signs. ?Remember to brush your teeth WITH YOUR REGULAR TOOTHPASTE. ? ? ? ? ? ?

## 2022-05-15 NOTE — Progress Notes (Signed)
Anesthesia Chart Review: Donna Hancock  Case: [id number] Date/Time: 05/16/22 1130   Procedure: VOCAL CORD MEDIALIZATION LARYNGOPLASTY   Anesthesia type: General   Pre-op diagnosis: Vocal cord paralysis   Location: [facility] OR ROOM 08 / [facility]   Surgeons: [name], MD       DISCUSSION: Patient is a 56 year old female scheduled for the above procedure. 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   History includes never smoker, post-operative NV, anxiety with panic attacks, IDA (resolved after endometrial ablation), IBS, papillary thyroid cancer (s/p left thyroidectomy 03/26/22).   ED visit 04/16/22 for SOB and throat discomfort that she felt had worsened over the previous few days since her thyroid surgery. Respiratory panel negative. Troponin negative x2. CBC and Lipase normal. CTA negative for PE. Neck CT showed surgical changes of left thyroidectomy but no focal collection or significant inflammation.  She denied chest pain, SOB, fever per PAT RN phone interview.   She is a same day work-up, so anesthesia team to evaluate on the day of surgery. She is for urine pregnancy test on the day of surgery.   VS: Ht 5' 7.5" (1.715 m)   Wt 83.5 kg   BMI 28.39 kg/m  BP Readings from Last 3 Encounters:  04/16/22 132/80  03/27/22 119/66  03/20/22 (!) 147/95   Pulse Readings from Last 3 Encounters:  04/16/22 66  03/27/22 81  03/20/22 85     PROVIDERS: Rudene Anda, MD is PCP  - She is awaiting an appointment with endocrinology. Dr. Janeice Robinson note mentions referring her to Dr. Katha Cabal for additional work-up for thyroid cancer.    LABS: On day of surgery as indicated. Labs from 04/16/22 include: Lab Results  Component Value Date   WBC 5.9 04/16/2022   HGB 14.1 04/16/2022   HCT 40.5 04/16/2022   PLT 170 04/16/2022    GLUCOSE 146 (H) 04/16/2022   ALT 27 04/16/2022   AST 24 04/16/2022   NA 138 04/16/2022   K 3.4 (L) 04/16/2022   CL 107 04/16/2022   CREATININE 0.79 04/16/2022   BUN 12 04/16/2022   CO2 23 04/16/2022  TSH normal at 4.04 on 04/29/22 (Care Everywhere).    IMAGES: CTA Chest 04/16/22: IMPRESSION: 1. No evidence of pulmonary embolus. 2. Postsurgical changes of left thyroidectomy.  CT Neck 04/16/22:  IMPRESSION: 1. Status post left thyroid surgery, with minimal postsurgical changes in the neck soft tissues anterior to the surgical bed. No focal collection or significant inflammation. 2. Otherwise negative CT of the neck.    EKG: 04/16/22: Sinus rhythm Low voltage, precordial leads RSR' in V1 or V2, right VCD or RVH Borderline T abnormalities, anterior leads Confirmed by Nanda Quinton [phone]) on 04/16/2022 12:24:41 PM  CV: N/A  Past Medical History:  Diagnosis Date   ANEMIA-IRON DEFICIENCY 12/21/2008   Likely related to menstruation- now resolved after ablation     Anxiety    IBS (irritable bowel syndrome)    stress related   Panic attack    Pneumonia    PONV (postoperative nausea and vomiting)    Tubular adenoma of colon    11/11/13 with 5 year repeat    Past Surgical History:  Procedure Laterality Date   ABLATION     uterine   CERVICAL POLYPECTOMY  09/29/2012   CHOLECYSTECTOMY     COLONOSCOPY  THYROIDECTOMY Left 03/26/2022   Procedure: THYROID LOBECTOMY;  Surgeon: [name], MD;  Location: [facility];  Service: ENT;  Laterality: Left;    MEDICATIONS: No current facility-administered medications for this encounter.    acetaminophen (TYLENOL) 500 MG tablet   aspirin-acetaminophen-caffeine (EXCEDRIN MIGRAINE) 250-250-65 MG tablet   cetirizine (ZYRTEC) 10 MG tablet   cholecalciferol (VITAMIN D3) 25 MCG (1000 UNIT) tablet   fluticasone (FLONASE) 50 MCG/ACT nasal spray   ibuprofen (ADVIL) 200 MG tablet   LORazepam (ATIVAN) 0.5 MG tablet    HYDROcodone-acetaminophen (HYCET) 7.5-325 mg/15 ml solution   HYDROcodone-acetaminophen (NORCO) 7.5-325 MG tablet   ondansetron (ZOFRAN-ODT) 4 MG disintegrating tablet    Myra Gianotti, PA-C Surgical Short Stay/Anesthesiology [facility] Phone [phone] [facility] Phone [phone] 05/15/2022 10:42 AM

## 2022-05-15 NOTE — Anesthesia Preprocedure Evaluation (Addendum)
Anesthesia Evaluation  Patient identified by MRN, date of birth, ID band Patient awake  General Assessment Comment:Vocal cord paralysis  Reviewed: Allergy & Precautions, NPO status , Patient's Chart, lab work & pertinent test results  History of Anesthesia Complications (+) PONV and history of anesthetic complications  Airway Mallampati: I  TM Distance: >3 FB Neck ROM: Full    Dental  (+) Teeth Intact, Dental Advisory Given   Pulmonary neg pulmonary ROS,    Pulmonary exam normal breath sounds clear to auscultation       Cardiovascular negative cardio ROS Normal cardiovascular exam Rhythm:Regular Rate:Normal     Neuro/Psych  Headaches, PSYCHIATRIC DISORDERS Anxiety    GI/Hepatic Neg liver ROS, GERD  ,H/o colon cancer    Endo/Other  negative endocrine ROS  Renal/GU negative Renal ROS     Musculoskeletal negative musculoskeletal ROS (+)   Abdominal   Peds  Hematology  (+) Blood dyscrasia, anemia ,   Anesthesia Other Findings Day of surgery medications reviewed with the patient.  Reproductive/Obstetrics                             Anesthesia Physical Anesthesia Plan  ASA: 2  Anesthesia Plan: MAC   Post-op Pain Management: Tylenol PO (pre-op)*   Induction: Intravenous  PONV Risk Score and Plan: 3 and TIVA, Treatment may vary due to age or medical condition and Midazolam  Airway Management Planned: Nasal Cannula and Natural Airway  Additional Equipment:   Intra-op Plan:   Post-operative Plan:   Informed Consent: I have reviewed the patients History and Physical, chart, labs and discussed the procedure including the risks, benefits and alternatives for the proposed anesthesia with the patient or authorized representative who has indicated his/her understanding and acceptance.     Dental advisory given  Plan Discussed with: CRNA  Anesthesia Plan Comments: (PAT note written  05/15/2022 by Myra Gianotti, PA-C. )       Anesthesia Quick Evaluation

## 2022-05-16 ENCOUNTER — Other Ambulatory Visit: Payer: Self-pay

## 2022-05-16 ENCOUNTER — Encounter (HOSPITAL_COMMUNITY): Payer: Self-pay | Admitting: Otolaryngology

## 2022-05-16 ENCOUNTER — Encounter (HOSPITAL_COMMUNITY)
Admission: RE | Disposition: A | Discharge status: Home / Self Care | Payer: Self-pay | Source: Home / Self Care | Attending: Otolaryngology

## 2022-05-16 ENCOUNTER — Ambulatory Visit (HOSPITAL_BASED_OUTPATIENT_CLINIC_OR_DEPARTMENT_OTHER): Payer: 59 | Admitting: Vascular Surgery

## 2022-05-16 ENCOUNTER — Ambulatory Visit (HOSPITAL_COMMUNITY): Payer: 59 | Admitting: Vascular Surgery

## 2022-05-16 ENCOUNTER — Observation Stay (HOSPITAL_COMMUNITY)
Admission: RE | Admit: 2022-05-16 | Discharge: 2022-05-17 | Disposition: A | Discharge status: Home / Self Care | Payer: 59 | Attending: Otolaryngology | Admitting: Otolaryngology

## 2022-05-16 DIAGNOSIS — Z20822 Contact with and (suspected) exposure to covid-19: Secondary | ICD-10-CM | POA: Insufficient documentation

## 2022-05-16 DIAGNOSIS — D649 Anemia, unspecified: Secondary | ICD-10-CM

## 2022-05-16 DIAGNOSIS — J3800 Paralysis of vocal cords and larynx, unspecified: Secondary | ICD-10-CM

## 2022-05-16 DIAGNOSIS — Z79899 Other long term (current) drug therapy: Secondary | ICD-10-CM | POA: Diagnosis not present

## 2022-05-16 DIAGNOSIS — Z7982 Long term (current) use of aspirin: Secondary | ICD-10-CM | POA: Insufficient documentation

## 2022-05-16 DIAGNOSIS — J3801 Paralysis of vocal cords and larynx, unilateral: Secondary | ICD-10-CM

## 2022-05-16 DIAGNOSIS — F419 Anxiety disorder, unspecified: Secondary | ICD-10-CM | POA: Diagnosis not present

## 2022-05-16 HISTORY — PX: LARYNGOPLASTY: SHX282

## 2022-05-16 LAB — SARS CORONAVIRUS 2 BY RT PCR: SARS Coronavirus 2 by RT PCR: NEGATIVE

## 2022-05-16 SURGERY — LARYNGOPLASTY
Anesthesia: Monitor Anesthesia Care | Site: Neck

## 2022-05-16 MED ORDER — ASPIRIN-ACETAMINOPHEN-CAFFEINE 250-250-65 MG PO TABS
1.0000 | ORAL_TABLET | Freq: Four times a day (QID) | ORAL | Status: DC | PRN
  Filled 2022-05-16: qty 1

## 2022-05-16 MED ORDER — FENTANYL CITRATE (PF) 250 MCG/5ML IJ SOLN
INTRAMUSCULAR | Status: AC
  Filled 2022-05-16: qty 5

## 2022-05-16 MED ORDER — ONDANSETRON HCL 4 MG/2ML IJ SOLN: 4.0000 mg | Freq: Once | INTRAMUSCULAR | Status: DC | PRN

## 2022-05-16 MED ORDER — PROPOFOL 500 MG/50ML IV EMUL
INTRAVENOUS | Status: DC | PRN
  Administered 2022-05-16: 50 ug/kg/min via INTRAVENOUS

## 2022-05-16 MED ORDER — LIDOCAINE-EPINEPHRINE 1 %-1:100000 IJ SOLN
INTRAMUSCULAR | Status: AC
  Filled 2022-05-16: qty 1

## 2022-05-16 MED ORDER — HYDROCODONE-ACETAMINOPHEN 5-325 MG PO TABS
1.0000 | ORAL_TABLET | ORAL | Status: DC | PRN
  Filled 2022-05-16: qty 2

## 2022-05-16 MED ORDER — MIDAZOLAM HCL 2 MG/2ML IJ SOLN
INTRAMUSCULAR | Status: AC
  Filled 2022-05-16: qty 2

## 2022-05-16 MED ORDER — ONDANSETRON HCL 4 MG PO TABS
4.0000 mg | ORAL_TABLET | ORAL | Status: DC | PRN
  Administered 2022-05-17: 4 mg via ORAL
  Filled 2022-05-16: qty 1

## 2022-05-16 MED ORDER — OXYMETAZOLINE HCL 0.05 % NA SOLN
2.0000 | NASAL | Status: DC
  Filled 2022-05-16: qty 30

## 2022-05-16 MED ORDER — LIDOCAINE 2% (20 MG/ML) 5 ML SYRINGE
INTRAMUSCULAR | Status: DC | PRN
  Administered 2022-05-16: 40 mg via INTRAVENOUS

## 2022-05-16 MED ORDER — MIDAZOLAM HCL 2 MG/2ML IJ SOLN
INTRAMUSCULAR | Status: DC | PRN
  Administered 2022-05-16: 2 mg via INTRAVENOUS

## 2022-05-16 MED ORDER — SCOPOLAMINE 1 MG/3DAYS TD PT72
1.0000 | MEDICATED_PATCH | TRANSDERMAL | Status: DC
  Administered 2022-05-16: 1.5 mg via TRANSDERMAL
  Filled 2022-05-16: qty 1

## 2022-05-16 MED ORDER — FENTANYL CITRATE (PF) 100 MCG/2ML IJ SOLN
INTRAMUSCULAR | Status: AC
  Filled 2022-05-16: qty 2

## 2022-05-16 MED ORDER — FLUTICASONE PROPIONATE 50 MCG/ACT NA SUSP: 2.0000 | Freq: Every day | NASAL | Status: DC | PRN

## 2022-05-16 MED ORDER — LACTATED RINGERS IV SOLN: INTRAVENOUS | Status: DC

## 2022-05-16 MED ORDER — DEXTROSE-NACL 5-0.9 % IV SOLN: INTRAVENOUS | Status: DC

## 2022-05-16 MED ORDER — FENTANYL CITRATE (PF) 250 MCG/5ML IJ SOLN
INTRAMUSCULAR | Status: DC | PRN
  Administered 2022-05-16: 50 ug via INTRAVENOUS

## 2022-05-16 MED ORDER — HYDROCODONE-ACETAMINOPHEN 7.5-325 MG PO TABS: 1.0000 | ORAL_TABLET | Freq: Four times a day (QID) | ORAL | Status: DC | PRN

## 2022-05-16 MED ORDER — IBUPROFEN 200 MG PO TABS: 200.0000 mg | ORAL_TABLET | Freq: Four times a day (QID) | ORAL | Status: DC | PRN

## 2022-05-16 MED ORDER — VITAMIN D 25 MCG (1000 UNIT) PO TABS: 1000.0000 [IU] | ORAL_TABLET | Freq: Every day | ORAL | Status: DC

## 2022-05-16 MED ORDER — HYDROCODONE-ACETAMINOPHEN 7.5-325 MG/15ML PO SOLN
10.0000 mL | ORAL | Status: DC | PRN
  Administered 2022-05-16: 20 mL via ORAL
  Administered 2022-05-16 – 2022-05-17 (×3): 15 mL via ORAL
  Filled 2022-05-16: qty 15
  Filled 2022-05-16: qty 30
  Filled 2022-05-16 (×2): qty 15

## 2022-05-16 MED ORDER — LORATADINE 10 MG PO TABS: 10.0000 mg | ORAL_TABLET | Freq: Every day | ORAL | Status: DC

## 2022-05-16 MED ORDER — CHLORHEXIDINE GLUCONATE 0.12 % MT SOLN
OROMUCOSAL | Status: AC
  Filled 2022-05-16: qty 15

## 2022-05-16 MED ORDER — ACETAMINOPHEN 500 MG PO TABS
1000.0000 mg | ORAL_TABLET | Freq: Once | ORAL | Status: AC
  Administered 2022-05-16: 1000 mg via ORAL
  Filled 2022-05-16: qty 2

## 2022-05-16 MED ORDER — OXYMETAZOLINE HCL 0.05 % NA SOLN
NASAL | Status: AC
  Filled 2022-05-16: qty 30

## 2022-05-16 MED ORDER — ONDANSETRON HCL 4 MG/2ML IJ SOLN: 4.0000 mg | INTRAMUSCULAR | Status: DC | PRN

## 2022-05-16 MED ORDER — LIDOCAINE-EPINEPHRINE 1 %-1:100000 IJ SOLN
INTRAMUSCULAR | Status: DC | PRN
  Administered 2022-05-16: 10 mL

## 2022-05-16 MED ORDER — ORAL CARE MOUTH RINSE: 15.0000 mL | Freq: Once | OROMUCOSAL | Status: AC

## 2022-05-16 MED ORDER — ONDANSETRON HCL 4 MG/2ML IJ SOLN
INTRAMUSCULAR | Status: DC | PRN
  Administered 2022-05-16: 4 mg via INTRAVENOUS

## 2022-05-16 MED ORDER — ONDANSETRON 4 MG PO TBDP: 4.0000 mg | ORAL_TABLET | Freq: Three times a day (TID) | ORAL | Status: DC | PRN

## 2022-05-16 MED ORDER — ACETAMINOPHEN 500 MG PO TABS: 1000.0000 mg | ORAL_TABLET | Freq: Four times a day (QID) | ORAL | Status: DC | PRN

## 2022-05-16 MED ORDER — SCOPOLAMINE 1 MG/3DAYS TD PT72
MEDICATED_PATCH | TRANSDERMAL | Status: AC
  Filled 2022-05-16: qty 1

## 2022-05-16 MED ORDER — LIDOCAINE HCL 4 % EX SOLN
CUTANEOUS | Status: DC | PRN
  Administered 2022-05-16: 2 mL via NASAL

## 2022-05-16 MED ORDER — HYDROCODONE-ACETAMINOPHEN 7.5-325 MG/15ML PO SOLN
15.0000 mL | Freq: Four times a day (QID) | ORAL | Status: DC | PRN
Start: 2022-05-16 — End: 2022-05-16

## 2022-05-16 MED ORDER — LORAZEPAM 0.5 MG PO TABS: 0.2500 mg | ORAL_TABLET | Freq: Two times a day (BID) | ORAL | Status: DC | PRN

## 2022-05-16 MED ORDER — OXYMETAZOLINE HCL 0.05 % NA SOLN
NASAL | Status: DC | PRN
  Administered 2022-05-16: 1

## 2022-05-16 MED ORDER — FENTANYL CITRATE (PF) 100 MCG/2ML IJ SOLN
25.0000 ug | INTRAMUSCULAR | Status: DC | PRN
  Administered 2022-05-16: 25 ug via INTRAVENOUS

## 2022-05-16 MED ORDER — ACETAMINOPHEN 160 MG/5ML PO SOLN: 650.0000 mg | Freq: Four times a day (QID) | ORAL | Status: DC | PRN

## 2022-05-16 MED ORDER — CEPHALEXIN 250 MG/5ML PO SUSR
500.0000 mg | Freq: Two times a day (BID) | ORAL | Status: AC
  Administered 2022-05-16 – 2022-05-17 (×2): 500 mg via ORAL
  Filled 2022-05-16 (×2): qty 10

## 2022-05-16 MED ORDER — CHLORHEXIDINE GLUCONATE 0.12 % MT SOLN
15.0000 mL | Freq: Once | OROMUCOSAL | Status: AC
  Administered 2022-05-16: 15 mL via OROMUCOSAL

## 2022-05-16 SURGICAL SUPPLY — 41 items
ADH SKN CLS APL DERMABOND .7 (GAUZE/BANDAGES/DRESSINGS) ×1
BAG COUNTER SPONGE SURGICOUNT (BAG) ×2 IMPLANT
BAG SPNG CNTER NS LX DISP (BAG) ×1
BAND INSRT 18 STRL LF DISP RB (MISCELLANEOUS) ×1
BAND RUBBER #18 3X1/16 STRL (MISCELLANEOUS) ×2 IMPLANT
BLADE SURG 10 STRL SS (BLADE) ×2 IMPLANT
BLADE SURG 15 STRL LF DISP TIS (BLADE) IMPLANT
BLADE SURG 15 STRL SS (BLADE)
BLOCK SILICONE 1.91X1.91 (MISCELLANEOUS) ×1 IMPLANT
BUR CROSS CUT FISSURE 1.6 (BURR) ×2 IMPLANT
CANISTER SUCT 3000ML PPV (MISCELLANEOUS) ×2 IMPLANT
CLEANER TIP ELECTROSURG 2X2 (MISCELLANEOUS) ×2 IMPLANT
CORD BIPOLAR FORCEPS 12FT (ELECTRODE) ×2 IMPLANT
COVER MAYO STAND STRL (DRAPES) IMPLANT
COVER SURGICAL LIGHT HANDLE (MISCELLANEOUS) ×2 IMPLANT
DERMABOND ADVANCED (GAUZE/BANDAGES/DRESSINGS) ×1
DERMABOND ADVANCED .7 DNX12 (GAUZE/BANDAGES/DRESSINGS) ×1 IMPLANT
DRAPE HALF SHEET 40X57 (DRAPES) IMPLANT
ELECT COATED BLADE 2.86 ST (ELECTRODE) ×2 IMPLANT
ELECT REM PT RETURN 9FT ADLT (ELECTROSURGICAL) ×2
ELECTRODE REM PT RTRN 9FT ADLT (ELECTROSURGICAL) ×1 IMPLANT
Extra Firm Consistency Silicone Block for Prosthes ×1 IMPLANT
FORCEPS BIPOLAR SPETZLER 8 1.0 (NEUROSURGERY SUPPLIES) ×1 IMPLANT
GAUZE SPONGE 4X4 12PLY STRL (GAUZE/BANDAGES/DRESSINGS) ×1 IMPLANT
GLOVE ECLIPSE 7.5 STRL STRAW (GLOVE) ×2 IMPLANT
GOWN STRL REUS W/ TWL LRG LVL3 (GOWN DISPOSABLE) ×2 IMPLANT
GOWN STRL REUS W/TWL LRG LVL3 (GOWN DISPOSABLE) ×4
KIT BASIN OR (CUSTOM PROCEDURE TRAY) ×2 IMPLANT
KIT TURNOVER KIT B (KITS) ×2 IMPLANT
NDL PRECISIONGLIDE 27X1.5 (NEEDLE) ×1 IMPLANT
NEEDLE PRECISIONGLIDE 27X1.5 (NEEDLE) ×2 IMPLANT
NS IRRIG 1000ML POUR BTL (IV SOLUTION) ×2 IMPLANT
PAD ARMBOARD 7.5X6 YLW CONV (MISCELLANEOUS) ×4 IMPLANT
PATTIES SURGICAL .5 X1 (DISPOSABLE) IMPLANT
PENCIL FOOT CONTROL (ELECTRODE) ×2 IMPLANT
SUT CHROMIC 3 0 PS 2 (SUTURE) ×2 IMPLANT
SUT CHROMIC 3 0 SH 27 (SUTURE) ×2 IMPLANT
SUT VIC AB 3-0 SH 27 (SUTURE) ×2
SUT VIC AB 3-0 SH 27XBRD (SUTURE) ×1 IMPLANT
TOWEL GREEN STERILE FF (TOWEL DISPOSABLE) ×2 IMPLANT
TRAY ENT MC OR (CUSTOM PROCEDURE TRAY) ×2 IMPLANT

## 2022-05-16 NOTE — Interval H&P Note (Signed)
History and Physical Interval Note:  05/16/2022 10:29 AM  Donna Hancock  has presented today for surgery, with the diagnosis of Vocal cord paralysis.  The various methods of treatment have been discussed with the patient and family. After consideration of risks, benefits and other options for treatment, the patient has consented to  Procedure(s): VOCAL CORD MEDIALIZATION LARYNGOPLASTY (N/A) as a surgical intervention.  The patient's history has been reviewed, patient examined, no change in status, stable for surgery.  I have reviewed the patient's chart and labs.  Questions were answered to the patient's satisfaction.     [name]

## 2022-05-16 NOTE — Op Note (Signed)
OPERATIVE REPORT  DATE OF SURGERY: 05/16/2022  PATIENT:  Donna Hancock,  56 y.o. female  PRE-OPERATIVE DIAGNOSIS:  Vocal cord paralysis, left  POST-OPERATIVE DIAGNOSIS:  Vocal cord paralysis, left  PROCEDURE:  Procedure(s): VOCAL CORD MEDIALIZATION LARYNGOPLASTY, left  SURGEON:  Beckie Salts, MD  ASSISTANTS: None  ANESTHESIA:   Local with intravenous sedation  EBL: 20 ml  DRAINS: Sterile rubber band  LOCAL MEDICATIONS USED: 1% Xylocaine with epinephrine  SPECIMEN:  none  COUNTS:  Correct  PROCEDURE DETAILS: The patient was taken to the operating room and placed on the operating table in the supine position. Following induction of intravenous sedation, the nose was sprayed with topical Afrin/lidocaine and the flexible fiberoptic scope was passed down the left nasal cavity used to visualize the larynx throughout the procedure and placed on the [facility] stand above the patient's head.  The neck was then prepped and draped in a standard fashion.  Transverse incision was outlined in a natural skin crease to about the level of the midportion of the thyroid cartilage.  Local anesthetic was infiltrated and #15 scalpel was used to incise the skin.  Dissection down to the strap muscles was performed and the straps were divided down the midline and reflected laterally.  The thyroid ala was exposed.  The left thyroid ala was then uncovered from perichondrium and the window was outlined with the electrocautery about 2 to 3 mm above the lower margin, about 1-1/2 cm in length and about 8 mm in height.  Electrocautery was used to provide the majority of the cartilaginous cuts to expose the window but the drill was used with a small sidecutting bur in the posterior aspect which was calcified.  The inner perichondrium was elevated using the [facility].  Using endoscopic guidance the localization of the cord and the proper placement of the prosthesis was achieved.  A Silastic block was then carved to shape  and size with adequate Silastic to fill in the window.  This was carved to shape and size multiple times to get a chest right.  The implant was then inserted.  It was found that with a little additional bulkiness posteriorly her voice was much stronger.  An extra small thin wedge of Silastic was then placed between the inner perichondrium and the posterior aspect of the implant.  This provided the strongest voice.  The wound was then irrigated with saline.  Interrupted 3-0 chromic was used to reapproximate the strap muscles.  Rubber band drain was left in place.  Wound was closed in layers using interrupted 3-0 chromic on the platysma and running subcuticular 3-0 chromic.  Dermabond was used on the skin.  A dressing was applied.  Scope was removed from the nasal cavity.  She tolerated this well was transferred to PACU in stable condition.      PATIENT DISPOSITION:  To PACU, stable

## 2022-05-16 NOTE — Anesthesia Procedure Notes (Signed)
Procedure Name: MAC Date/Time: 05/16/2022 11:20 AM Performed by: Leonor Liv, CRNA Pre-anesthesia Checklist: Patient identified, Suction available, Emergency Drugs available, Patient being monitored and Timeout performed Oxygen Delivery Method: Nasal cannula Placement Confirmation: positive ETCO2 Dental Injury: Teeth and Oropharynx as per pre-operative assessment

## 2022-05-16 NOTE — Progress Notes (Signed)
Postop rounds  She is awake and alert, taking food by mouth but a little bit sore.  Her breathing is fine.  Her voice is much stronger.  She is very pleased.  Neck looks excellent.  Dressing is dry.  Stable postop.  Continue overnight observation.

## 2022-05-16 NOTE — Transfer of Care (Signed)
Immediate Anesthesia Transfer of Care Note  Patient: Donna Hancock  Procedure(s) Performed: VOCAL CORD MEDIALIZATION LARYNGOPLASTY (Neck)  Patient Location: PACU  Anesthesia Type:General  Level of Consciousness: awake, alert  and oriented  Airway & Oxygen Therapy: Patient Spontanous Breathing and Patient connected to nasal cannula oxygen  Post-op Assessment: Report given to RN, Post -op Vital signs reviewed and stable and Patient moving all extremities  Post vital signs: Reviewed and stable  Last Vitals:  Vitals Value Taken Time  BP 160/88 05/16/22 1409  Temp 36.6 C 05/16/22 1252  Pulse 68 05/16/22 1409  Resp 18 05/16/22 1409  SpO2 99 % 05/16/22 1409    Last Pain:  Vitals:   05/16/22 1313  TempSrc:   PainSc: 5       Patients Stated Pain Goal: 2 (24/46/95 0722)  Complications: No notable events documented.

## 2022-05-17 DIAGNOSIS — J3801 Paralysis of vocal cords and larynx, unilateral: Secondary | ICD-10-CM | POA: Diagnosis not present

## 2022-05-17 MED ORDER — HYDROCODONE-ACETAMINOPHEN 7.5-325 MG PO TABS: 1.0000 | ORAL_TABLET | Freq: Four times a day (QID) | ORAL | 0 refills | Status: AC | PRN

## 2022-05-17 NOTE — Discharge Summary (Signed)
Physician Discharge Summary  Patient ID: Donna Hancock MRN: [id number] DOB/AGE: July 13, 1966 56 y.o.  Admit date: 05/16/2022 Discharge date: 05/17/2022  Admission Diagnoses: Left vocal cord paralysis  Discharge Diagnoses:  Principal Problem:   Vocal cord paralysis   Discharged Condition: good  Hospital Course: No complications  Consults: none  Significant Diagnostic Studies: none  Treatments: surgery: Left medialization laryngoplasty  Discharge Exam: Blood pressure 113/70, pulse 73, temperature 97.6 F (36.4 C), resp. rate 16, height 5' 7.5" (1.715 m), weight 83.5 kg, SpO2 94 %. PHYSICAL EXAM: Awake and alert.  Breathing is excellent.  Voice is much stronger.  Incision looks good.  Drain removed.  Disposition: Discharge disposition: 01-Home or Self Care       Discharge Instructions     Diet - low sodium heart healthy   Complete by: As directed    Discharge wound care:   Complete by: As directed    Keep the dressing in place in case there is any drainage.  Once there is no drainage is not necessary.  Okay to shower.   Increase activity slowly   Complete by: As directed       Allergies as of 05/17/2022       Reactions   Citalopram Hydrobromide    Feels funny   Lexapro [escitalopram Oxalate]    Head feels off/funny   Wellbutrin [bupropion Hcl] Other (See Comments)   aggitation and figity        Medication List     TAKE these medications    acetaminophen 500 MG tablet Commonly known as: TYLENOL Take 1,000 mg by mouth every 6 (six) hours as needed for mild pain.   aspirin-acetaminophen-caffeine 250-250-65 MG tablet Commonly known as: EXCEDRIN MIGRAINE Take 1 tablet by mouth every 6 (six) hours as needed for headache or migraine.   cetirizine 10 MG tablet Commonly known as: ZYRTEC Take 10 mg by mouth daily as needed for allergies.   cholecalciferol 25 MCG (1000 UNIT) tablet Commonly known as: VITAMIN D3 Take 1,000 Units by mouth daily.    fluticasone 50 MCG/ACT nasal spray Commonly known as: FLONASE Place 2 sprays into both nostrils daily. What changed:  when to take this reasons to take this   HYDROcodone-acetaminophen 7.5-325 MG tablet Commonly known as: Norco Take 1 tablet by mouth every 6 (six) hours as needed for moderate pain. What changed: Another medication with the same name was added. Make sure you understand how and when to take each.   HYDROcodone-acetaminophen 7.5-325 mg/15 ml solution Commonly known as: HYCET Take 15 mLs by mouth 4 (four) times daily as needed for moderate pain. What changed: Another medication with the same name was added. Make sure you understand how and when to take each.   HYDROcodone-acetaminophen 7.5-325 MG tablet Commonly known as: Norco Take 1 tablet by mouth every 6 (six) hours as needed for moderate pain. What changed: You were already taking a medication with the same name, and this prescription was added. Make sure you understand how and when to take each.   ibuprofen 200 MG tablet Commonly known as: ADVIL Take 200 mg by mouth every 6 (six) hours as needed for mild pain.   LORazepam 0.5 MG tablet Commonly known as: ATIVAN Take 1 tablet (0.5 mg total) by mouth 2 (two) times daily as needed. for anxiety What changed:  how much to take reasons to take this   ondansetron 4 MG disintegrating tablet Commonly known as: ZOFRAN-ODT Take 1 tablet (4 mg total) by mouth every  8 (eight) hours as needed for nausea or vomiting.               Discharge Care Instructions  (From admission, onward)           Start     Ordered   05/17/22 0000  Discharge wound care:       Comments: Keep the dressing in place in case there is any drainage.  Once there is no drainage is not necessary.  Okay to shower.   05/17/22 0823             Signed: [name] 05/17/2022, 8:23 AM

## 2022-05-17 NOTE — Progress Notes (Signed)
Patient alert and oriented, voiding adequately, skin clean, dry and intact without evidence of skin break down, or symptoms of complications - no redness or edema noted, only slight tenderness at site.  Patient states pain is manageable at time of discharge. Patient has an appointment with MD.

## 2022-05-19 ENCOUNTER — Encounter (HOSPITAL_COMMUNITY): Payer: Self-pay | Admitting: Otolaryngology

## 2022-05-19 NOTE — Anesthesia Postprocedure Evaluation (Signed)
Anesthesia Post Note  Patient: Donna Hancock  Procedure(s) Performed: VOCAL CORD MEDIALIZATION LARYNGOPLASTY (Neck)     Patient location during evaluation: PACU Anesthesia Type: MAC Level of consciousness: awake and alert Pain management: pain level controlled Vital Signs Assessment: post-procedure vital signs reviewed and stable Respiratory status: spontaneous breathing, nonlabored ventilation, respiratory function stable and patient connected to nasal cannula oxygen Cardiovascular status: stable and blood pressure returned to baseline Postop Assessment: no apparent nausea or vomiting Anesthetic complications: no   No notable events documented.  Last Vitals:  Vitals:   05/17/22 0458 05/17/22 0725  BP: 125/85 113/70  Pulse: 68 73  Resp: 18 16  Temp: 36.9 C 36.4 C  SpO2: 100% 94%    Last Pain:  Vitals:   05/17/22 0740  TempSrc:   PainSc: 3                  [name]

## 2022-06-20 ENCOUNTER — Ambulatory Visit (INDEPENDENT_AMBULATORY_CARE_PROVIDER_SITE_OTHER): Payer: 59 | Admitting: Internal Medicine

## 2022-06-20 ENCOUNTER — Encounter: Payer: Self-pay | Admitting: Internal Medicine

## 2022-06-20 VITALS — BP 114/70 | HR 81 | Ht 67.5 in | Wt 186.0 lb

## 2022-06-20 DIAGNOSIS — C73 Malignant neoplasm of thyroid gland: Secondary | ICD-10-CM

## 2022-06-23 MED ORDER — LEVOTHYROXINE SODIUM 50 MCG PO TABS: 50.0000 ug | ORAL_TABLET | Freq: Every day | ORAL | 3 refills | Status: DC

## 2022-06-27 LAB — TSH: TSH: 4.18 u[IU]/mL (ref 0.450–4.500)

## 2022-06-27 LAB — THYROGLOBULIN LEVEL: Thyroglobulin (TG-RIA): 19 ng/mL

## 2022-06-27 LAB — THYROGLOBULIN ANTIBODY: Thyroglobulin Antibody: <1 IU/mL (ref 0.0–0.9)

## 2022-07-18 ENCOUNTER — Telehealth: Payer: Self-pay | Admitting: Internal Medicine

## 2022-07-18 NOTE — Telephone Encounter (Signed)
Patient called to reschedule her November appointment and was asking about an ultrasound that she thought was supposed to be scheduled.  Patient was calling to check on the date it was scheduled for.

## 2022-07-21 NOTE — Telephone Encounter (Signed)
I erroneously sent this message this past Friday to the wrong office.

## 2022-07-22 NOTE — Telephone Encounter (Signed)
Patient advised that [place] will be ordered at next appointment per last office note

## 2022-10-13 ENCOUNTER — Telehealth: Payer: Self-pay

## 2022-10-13 NOTE — Telephone Encounter (Signed)
Patient would like to be schedule sooner

## 2022-10-31 ENCOUNTER — Ambulatory Visit: Payer: 59 | Admitting: Internal Medicine

## 2022-11-11 IMAGING — CT CT ANGIO CHEST
2 of 8 series · 19 of 36 positions shown · IV contrast (Omnipaque)
Comparison: None.

CLINICAL DATA: Pulmonary embolus suspected

EXAM:
CT ANGIOGRAPHY CHEST WITH CONTRAST
TECHNIQUE: Multidetector CT imaging of the chest was performed using the
standard protocol during bolus administration of intravenous
contrast. Multiplanar CT image reconstructions and MIPs were
obtained to evaluate the vascular anatomy.

[Series 8: pe thins · axial · 0.60mm/px · z∈[-418,-188]mm · 18 of 259 slices shown]
[im 14/259  lung]
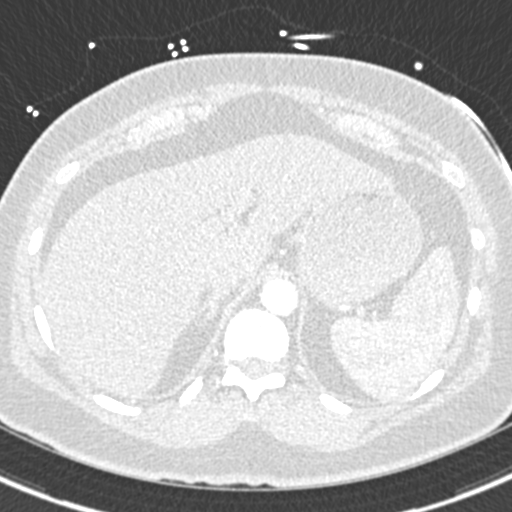
[im 28/259  mediastinal]
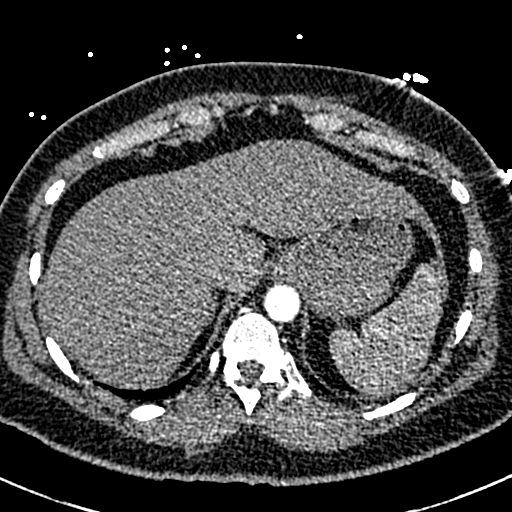
[im 41/259  lung]
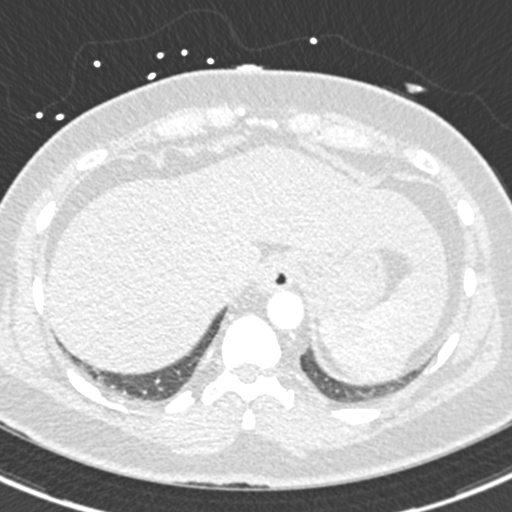
[im 55/259  mediastinal]
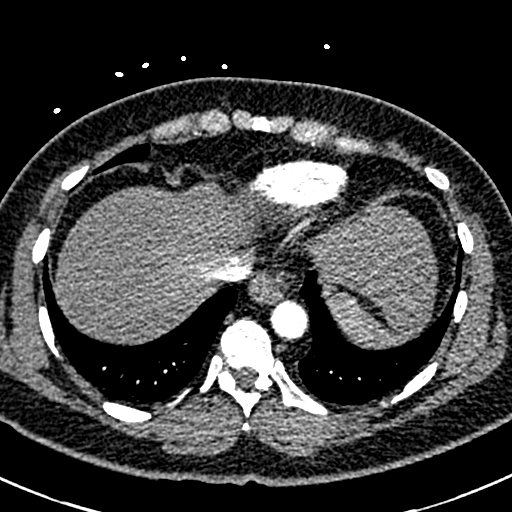
[im 68/259  lung]
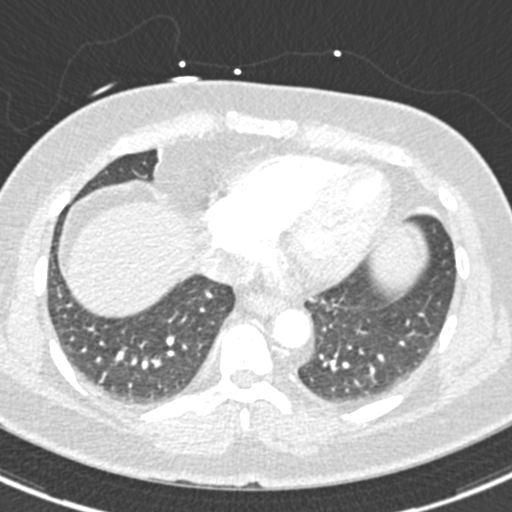
[im 82/259  mediastinal]
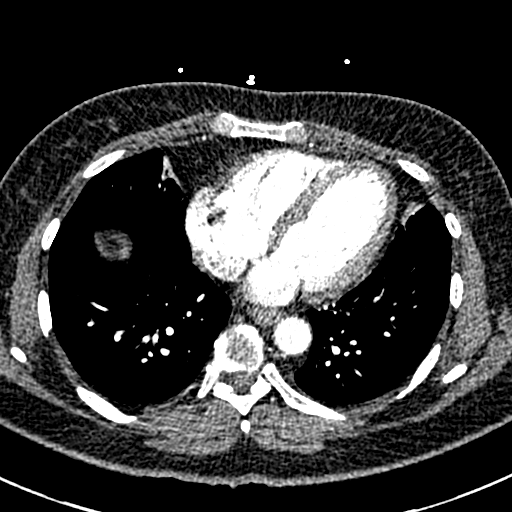
[im 96/259  lung]
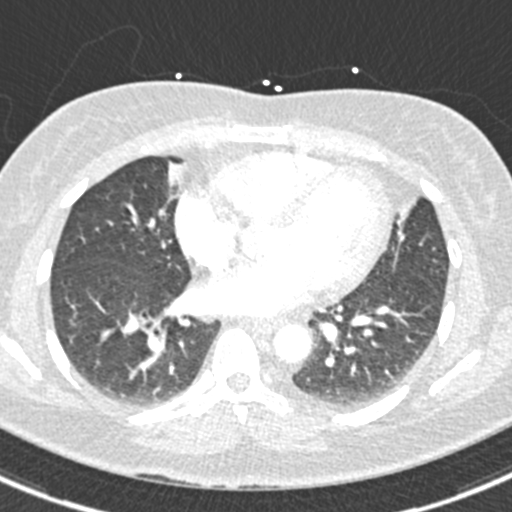
[im 109/259  mediastinal]
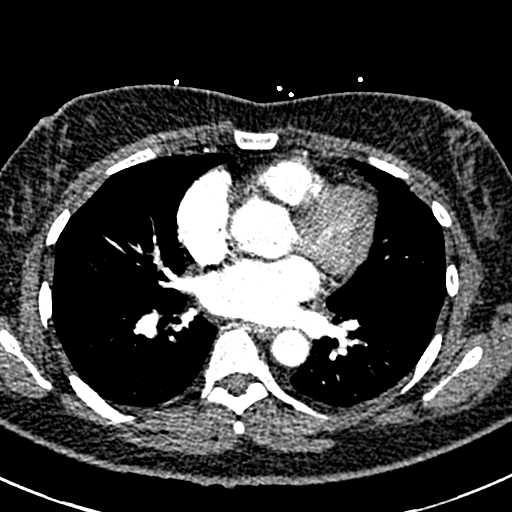
[im 123/259  lung]
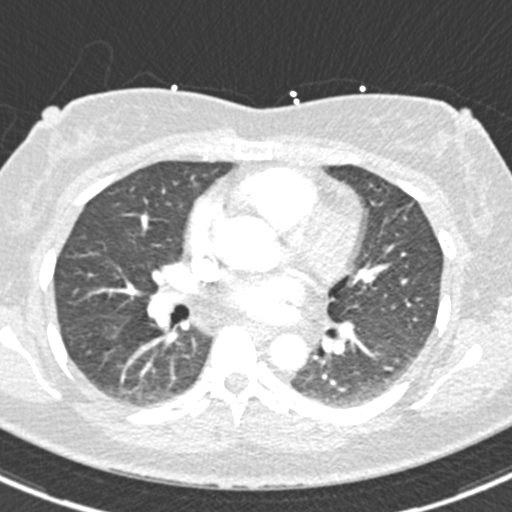
[im 136/259  mediastinal]
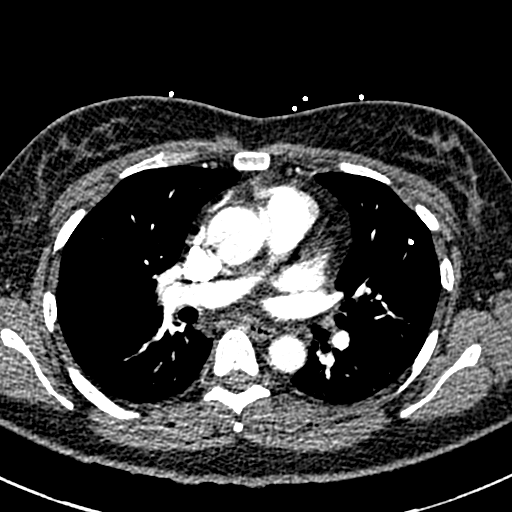
[im 150/259  lung]
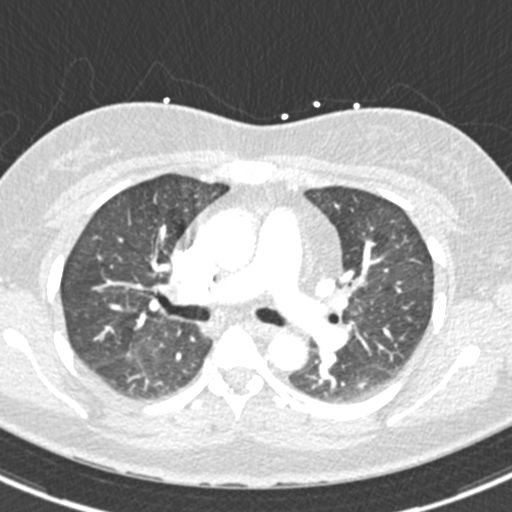
[im 163/259  mediastinal]
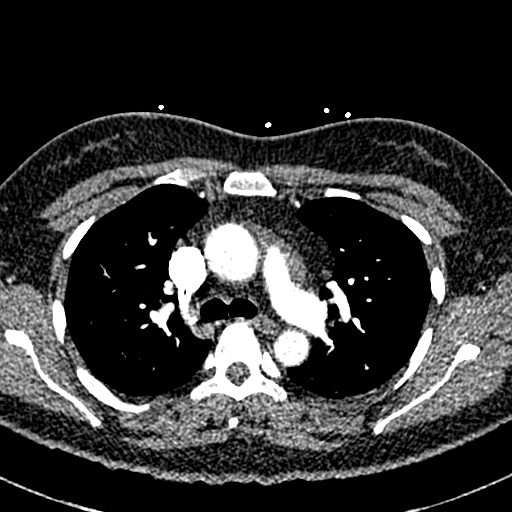
[im 177/259  lung]
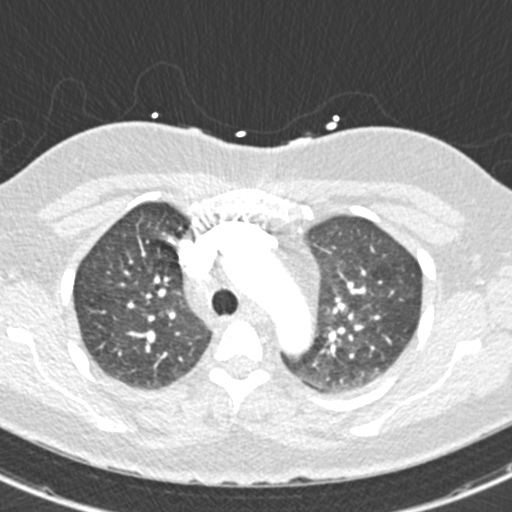
[im 191/259  mediastinal]
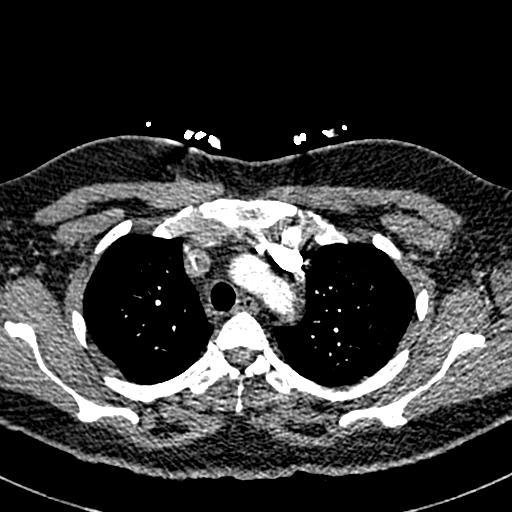
[im 204/259  lung]
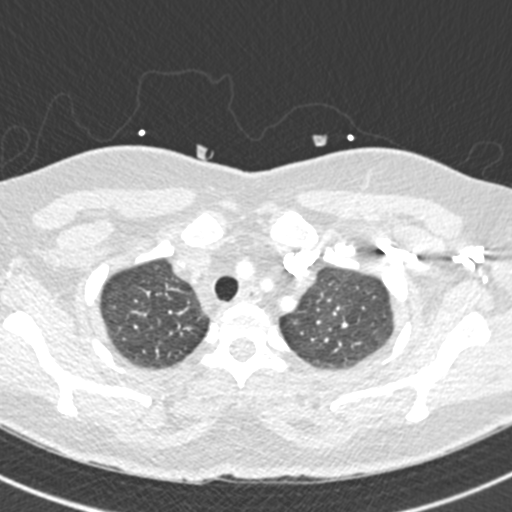
[im 218/259  mediastinal]
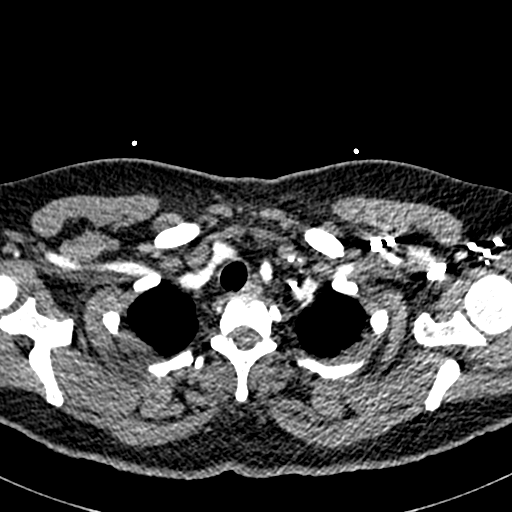
[im 231/259  lung]
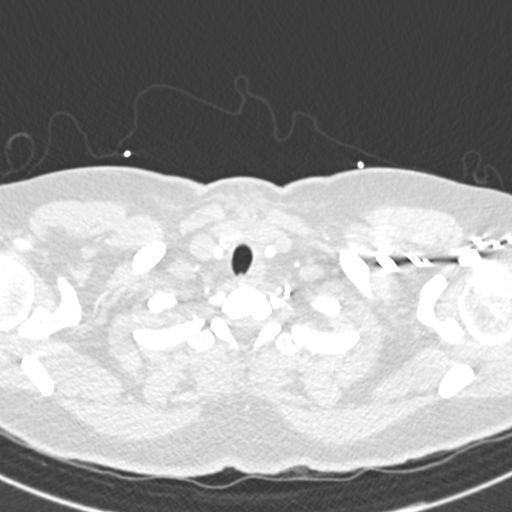
[im 245/259  mediastinal]
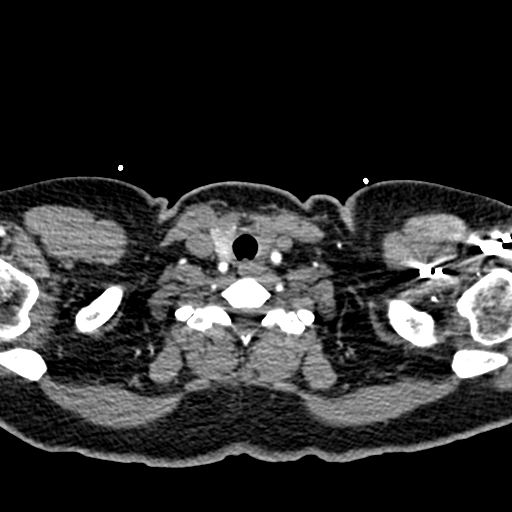

[Series 9: pe coronal mpr · coronal · 0.53mm/px · 1 of 109 slices shown]
[im 55/109  mediastinal]
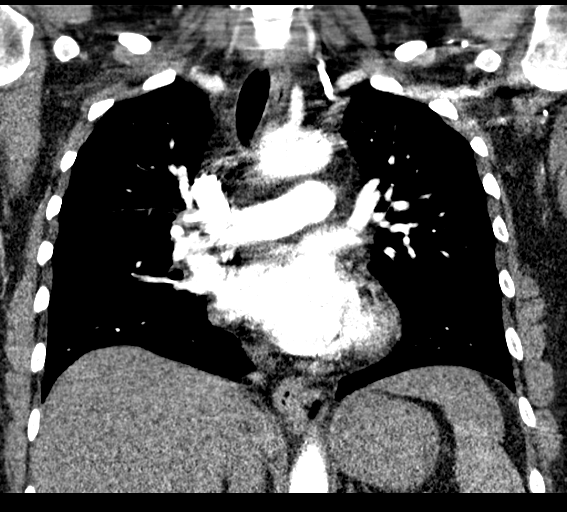

[19 of 36 positions shown; findings below may reference images not displayed]

RADIATION DOSE REDUCTION: This exam was performed according to the
departmental dose-optimization program which includes automated
exposure control, adjustment of the mA and/or kV according to
patient size and/or use of iterative reconstruction technique.

CONTRAST:  75mL OMNIPAQUE IOHEXOL 350 MG/ML SOLN
FINDINGS: Cardiovascular: Satisfactory contrast opacification of the pulmonary
arteries. No evidence of pulmonary embolus. Normal heart size. No
pericardial effusion. No significant vascular findings.

Mediastinum/Nodes: Small hiatal hernia. Prior left thyroidectomy
with mild adjacent soft tissue stranding which is likely
postsurgical. No pathologically enlarged lymph nodes seen in the
chest.

Lungs/Pleura: Central airways are patent. No consolidation, pleural
effusion or pneumothorax.

Upper Abdomen: No acute abnormality.

Musculoskeletal: No chest wall abnormality. No acute or significant
osseous findings.

Review of the MIP images confirms the above findings.
IMPRESSION: 1. No evidence of pulmonary embolus.
2. Postsurgical changes of left thyroidectomy.

## 2022-11-13 ENCOUNTER — Ambulatory Visit: Payer: 59 | Admitting: Internal Medicine

## 2022-12-05 ENCOUNTER — Ambulatory Visit: Payer: 59 | Admitting: Internal Medicine

## 2022-12-10 ENCOUNTER — Other Ambulatory Visit: Payer: Self-pay | Admitting: Internal Medicine

## 2023-10-31 ENCOUNTER — Other Ambulatory Visit: Payer: Self-pay | Admitting: Internal Medicine
